# Patient Record
Sex: Male | Born: 1935 | Race: White | Hispanic: No | Marital: Married | Smoking: Former smoker
Health system: Southern US, Community
[De-identification: ages and names within clinical notes are randomized; demographics above are authoritative.]

---

## 2021-06-11 ENCOUNTER — Inpatient Hospital Stay (HOSPITAL_COMMUNITY)
Admission: EM | Admit: 2021-06-11 | Discharge: 2021-06-24 | DRG: 239 | Disposition: A | Discharge status: Skilled Nursing Facility | Payer: Medicare Other | Attending: Internal Medicine | Admitting: Internal Medicine

## 2021-06-11 ENCOUNTER — Emergency Department (HOSPITAL_COMMUNITY): Payer: Medicare Other

## 2021-06-11 DIAGNOSIS — Z79899 Other long term (current) drug therapy: Secondary | ICD-10-CM

## 2021-06-11 DIAGNOSIS — E1122 Type 2 diabetes mellitus with diabetic chronic kidney disease: Secondary | ICD-10-CM | POA: Diagnosis present

## 2021-06-11 DIAGNOSIS — D509 Iron deficiency anemia, unspecified: Secondary | ICD-10-CM | POA: Diagnosis present

## 2021-06-11 DIAGNOSIS — E1169 Type 2 diabetes mellitus with other specified complication: Secondary | ICD-10-CM | POA: Diagnosis present

## 2021-06-11 DIAGNOSIS — N179 Acute kidney failure, unspecified: Secondary | ICD-10-CM | POA: Diagnosis present

## 2021-06-11 DIAGNOSIS — L89616 Pressure-induced deep tissue damage of right heel: Secondary | ICD-10-CM | POA: Diagnosis present

## 2021-06-11 DIAGNOSIS — L089 Local infection of the skin and subcutaneous tissue, unspecified: Secondary | ICD-10-CM | POA: Diagnosis present

## 2021-06-11 DIAGNOSIS — E876 Hypokalemia: Secondary | ICD-10-CM | POA: Diagnosis present

## 2021-06-11 DIAGNOSIS — I2510 Atherosclerotic heart disease of native coronary artery without angina pectoris: Secondary | ICD-10-CM | POA: Diagnosis present

## 2021-06-11 DIAGNOSIS — L03032 Cellulitis of left toe: Secondary | ICD-10-CM | POA: Diagnosis present

## 2021-06-11 DIAGNOSIS — Z87891 Personal history of nicotine dependence: Secondary | ICD-10-CM

## 2021-06-11 DIAGNOSIS — N1831 Chronic kidney disease, stage 3a: Secondary | ICD-10-CM | POA: Diagnosis present

## 2021-06-11 DIAGNOSIS — I96 Gangrene, not elsewhere classified: Secondary | ICD-10-CM | POA: Diagnosis present

## 2021-06-11 DIAGNOSIS — Z7901 Long term (current) use of anticoagulants: Secondary | ICD-10-CM

## 2021-06-11 DIAGNOSIS — B351 Tinea unguium: Secondary | ICD-10-CM | POA: Diagnosis present

## 2021-06-11 DIAGNOSIS — Z951 Presence of aortocoronary bypass graft: Secondary | ICD-10-CM

## 2021-06-11 DIAGNOSIS — I4891 Unspecified atrial fibrillation: Secondary | ICD-10-CM | POA: Diagnosis present

## 2021-06-11 DIAGNOSIS — E440 Moderate protein-calorie malnutrition: Secondary | ICD-10-CM | POA: Diagnosis present

## 2021-06-11 DIAGNOSIS — I480 Paroxysmal atrial fibrillation: Secondary | ICD-10-CM | POA: Diagnosis present

## 2021-06-11 DIAGNOSIS — L0390 Cellulitis, unspecified: Secondary | ICD-10-CM | POA: Diagnosis present

## 2021-06-11 DIAGNOSIS — Z7951 Long term (current) use of inhaled steroids: Secondary | ICD-10-CM

## 2021-06-11 DIAGNOSIS — L03116 Cellulitis of left lower limb: Secondary | ICD-10-CM

## 2021-06-11 DIAGNOSIS — E11628 Type 2 diabetes mellitus with other skin complications: Secondary | ICD-10-CM | POA: Diagnosis present

## 2021-06-11 DIAGNOSIS — I130 Hypertensive heart and chronic kidney disease with heart failure and stage 1 through stage 4 chronic kidney disease, or unspecified chronic kidney disease: Secondary | ICD-10-CM | POA: Diagnosis present

## 2021-06-11 DIAGNOSIS — E860 Dehydration: Secondary | ICD-10-CM | POA: Diagnosis present

## 2021-06-11 DIAGNOSIS — L8990 Pressure ulcer of unspecified site, unspecified stage: Secondary | ICD-10-CM | POA: Diagnosis present

## 2021-06-11 DIAGNOSIS — E785 Hyperlipidemia, unspecified: Secondary | ICD-10-CM | POA: Diagnosis present

## 2021-06-11 DIAGNOSIS — E1165 Type 2 diabetes mellitus with hyperglycemia: Secondary | ICD-10-CM | POA: Diagnosis not present

## 2021-06-11 DIAGNOSIS — L89626 Pressure-induced deep tissue damage of left heel: Secondary | ICD-10-CM | POA: Diagnosis present

## 2021-06-11 DIAGNOSIS — E11621 Type 2 diabetes mellitus with foot ulcer: Secondary | ICD-10-CM | POA: Diagnosis present

## 2021-06-11 DIAGNOSIS — M869 Osteomyelitis, unspecified: Secondary | ICD-10-CM | POA: Diagnosis present

## 2021-06-11 DIAGNOSIS — L97529 Non-pressure chronic ulcer of other part of left foot with unspecified severity: Secondary | ICD-10-CM | POA: Diagnosis present

## 2021-06-11 DIAGNOSIS — E1152 Type 2 diabetes mellitus with diabetic peripheral angiopathy with gangrene: Secondary | ICD-10-CM | POA: Diagnosis not present

## 2021-06-11 DIAGNOSIS — Z7982 Long term (current) use of aspirin: Secondary | ICD-10-CM

## 2021-06-11 DIAGNOSIS — I509 Heart failure, unspecified: Secondary | ICD-10-CM | POA: Diagnosis present

## 2021-06-11 DIAGNOSIS — U071 COVID-19: Secondary | ICD-10-CM | POA: Diagnosis present

## 2021-06-11 DIAGNOSIS — E119 Type 2 diabetes mellitus without complications: Secondary | ICD-10-CM

## 2021-06-11 LAB — COMPREHENSIVE METABOLIC PANEL
ALT: 18 U/L (ref 0–44)
AST: 26 U/L (ref 15–41)
Albumin: 3.2 g/dL — ABNORMAL LOW (ref 3.5–5.0)
Alkaline Phosphatase: 51 U/L (ref 38–126)
Anion gap: 8 (ref 5–15)
BUN: 32 mg/dL — ABNORMAL HIGH (ref 8–23)
CO2: 21 mmol/L — ABNORMAL LOW (ref 22–32)
Calcium: 8.6 mg/dL — ABNORMAL LOW (ref 8.9–10.3)
Chloride: 107 mmol/L (ref 98–111)
Creatinine, Ser: 1.42 mg/dL — ABNORMAL HIGH (ref 0.61–1.24)
GFR, Estimated: 48 mL/min — ABNORMAL LOW (ref >60)
Glucose, Bld: 222 mg/dL — ABNORMAL HIGH (ref 70–99)
Potassium: 4 mmol/L (ref 3.5–5.1)
Sodium: 136 mmol/L (ref 135–145)
Total Bilirubin: 0.4 mg/dL (ref 0.3–1.2)
Total Protein: 6.3 g/dL — ABNORMAL LOW (ref 6.5–8.1)

## 2021-06-11 LAB — CBC WITH DIFFERENTIAL/PLATELET
Abs Immature Granulocytes: 0.02 10*3/uL (ref 0.00–0.07)
Basophils Absolute: 0 10*3/uL (ref 0.0–0.1)
Basophils Relative: 1 %
Eosinophils Absolute: 0.2 10*3/uL (ref 0.0–0.5)
Eosinophils Relative: 2 %
HCT: 42.4 % (ref 39.0–52.0)
Hemoglobin: 13.6 g/dL (ref 13.0–17.0)
Immature Granulocytes: 0 %
Lymphocytes Relative: 18 %
Lymphs Abs: 1.3 10*3/uL (ref 0.7–4.0)
MCH: 28.9 pg (ref 26.0–34.0)
MCHC: 32.1 g/dL (ref 30.0–36.0)
MCV: 90.2 fL (ref 80.0–100.0)
Monocytes Absolute: 0.5 10*3/uL (ref 0.1–1.0)
Monocytes Relative: 8 %
Neutro Abs: 4.9 10*3/uL (ref 1.7–7.7)
Neutrophils Relative %: 71 %
Platelets: 202 10*3/uL (ref 150–400)
RBC: 4.7 MIL/uL (ref 4.22–5.81)
RDW: 12.6 % (ref 11.5–15.5)
WBC: 7 10*3/uL (ref 4.0–10.5)
nRBC: 0 % (ref 0.0–0.2)

## 2021-06-11 LAB — APTT: aPTT: 44 seconds — ABNORMAL HIGH (ref 24–36)

## 2021-06-11 LAB — PROTIME-INR
INR: 1.3 — ABNORMAL HIGH (ref 0.8–1.2)
Prothrombin Time: 16.2 seconds — ABNORMAL HIGH (ref 11.4–15.2)

## 2021-06-11 LAB — LACTIC ACID, PLASMA: Lactic Acid, Venous: 0.8 mmol/L (ref 0.5–1.9)

## 2021-06-11 NOTE — ED Provider Notes (Signed)
Emergency Medicine Provider Triage Evaluation Note  Cameron Ruiz , a 87 y.o. male  was evaluated in triage.  Pt complains of left foot wound. Was at podiatrist pta and they were concerned for vascular etiology as they noted gangrenous changes to the foot and wer unable to find a pulse.  Review of Systems  Positive: Wound to foot Negative: fevers  Physical Exam  BP (!) 158/59   Pulse 64   Temp 99.8 F (37.7 C) (Oral)   Resp 16   SpO2 98%  Gen:   Awake, no distress   Resp:  Normal effort  MSK:   Moves extremities without difficulty Other:  Multiple open wounds to the left foot with erythema and warmth to the dorsum of the foot. DP pulse is dopplerable  Medical Decision Making  Medically screening exam initiated at 5:30 PM.  Appropriate orders placed.  Cameron Ruiz was informed that the remainder of the evaluation will be completed by another provider, this initial triage assessment does not replace that evaluation, and the importance of remaining in the ED until their evaluation is complete.     Rayne Du 06/11/21 1733    Gwyneth Sprout, MD 06/11/21 1756

## 2021-06-11 NOTE — ED Triage Notes (Signed)
Pt seen at podiatrist today, they were unable to find pedal pulses & noted "gangrenous changes," was advised to come to [facility] for further eval to rule out vascular issue. Pt states issue started in L leg/shin area, "weeping," then went down into foot. On 2 oral abx, compliant w both as well as home medicaitons. Pt legally blind, granddaughter w pt.

## 2021-06-11 NOTE — ED Notes (Addendum)
Pedal pulse found using doppler in triage, marked using skin marker

## 2021-06-12 ENCOUNTER — Inpatient Hospital Stay (HOSPITAL_COMMUNITY): Payer: Medicare Other

## 2021-06-12 ENCOUNTER — Encounter (HOSPITAL_COMMUNITY): Payer: Medicare Other

## 2021-06-12 ENCOUNTER — Encounter (HOSPITAL_COMMUNITY): Payer: Self-pay | Admitting: Emergency Medicine

## 2021-06-12 DIAGNOSIS — L089 Local infection of the skin and subcutaneous tissue, unspecified: Secondary | ICD-10-CM | POA: Diagnosis not present

## 2021-06-12 DIAGNOSIS — B351 Tinea unguium: Secondary | ICD-10-CM | POA: Diagnosis present

## 2021-06-12 DIAGNOSIS — N1831 Chronic kidney disease, stage 3a: Secondary | ICD-10-CM | POA: Diagnosis present

## 2021-06-12 DIAGNOSIS — I10 Essential (primary) hypertension: Secondary | ICD-10-CM | POA: Diagnosis not present

## 2021-06-12 DIAGNOSIS — L0390 Cellulitis, unspecified: Secondary | ICD-10-CM | POA: Diagnosis present

## 2021-06-12 DIAGNOSIS — M869 Osteomyelitis, unspecified: Secondary | ICD-10-CM | POA: Diagnosis present

## 2021-06-12 DIAGNOSIS — L03116 Cellulitis of left lower limb: Secondary | ICD-10-CM | POA: Diagnosis present

## 2021-06-12 DIAGNOSIS — I70223 Atherosclerosis of native arteries of extremities with rest pain, bilateral legs: Secondary | ICD-10-CM | POA: Diagnosis not present

## 2021-06-12 DIAGNOSIS — Z7982 Long term (current) use of aspirin: Secondary | ICD-10-CM | POA: Diagnosis not present

## 2021-06-12 DIAGNOSIS — Z7901 Long term (current) use of anticoagulants: Secondary | ICD-10-CM | POA: Diagnosis not present

## 2021-06-12 DIAGNOSIS — E1169 Type 2 diabetes mellitus with other specified complication: Secondary | ICD-10-CM | POA: Diagnosis present

## 2021-06-12 DIAGNOSIS — L89626 Pressure-induced deep tissue damage of left heel: Secondary | ICD-10-CM | POA: Diagnosis present

## 2021-06-12 DIAGNOSIS — E440 Moderate protein-calorie malnutrition: Secondary | ICD-10-CM | POA: Diagnosis present

## 2021-06-12 DIAGNOSIS — I70262 Atherosclerosis of native arteries of extremities with gangrene, left leg: Secondary | ICD-10-CM | POA: Diagnosis not present

## 2021-06-12 DIAGNOSIS — E1152 Type 2 diabetes mellitus with diabetic peripheral angiopathy with gangrene: Secondary | ICD-10-CM | POA: Diagnosis present

## 2021-06-12 DIAGNOSIS — E1122 Type 2 diabetes mellitus with diabetic chronic kidney disease: Secondary | ICD-10-CM | POA: Diagnosis present

## 2021-06-12 DIAGNOSIS — I2510 Atherosclerotic heart disease of native coronary artery without angina pectoris: Secondary | ICD-10-CM | POA: Diagnosis present

## 2021-06-12 DIAGNOSIS — E11621 Type 2 diabetes mellitus with foot ulcer: Secondary | ICD-10-CM | POA: Diagnosis present

## 2021-06-12 DIAGNOSIS — E119 Type 2 diabetes mellitus without complications: Secondary | ICD-10-CM | POA: Diagnosis not present

## 2021-06-12 DIAGNOSIS — I96 Gangrene, not elsewhere classified: Secondary | ICD-10-CM | POA: Diagnosis not present

## 2021-06-12 DIAGNOSIS — U071 COVID-19: Secondary | ICD-10-CM | POA: Diagnosis present

## 2021-06-12 DIAGNOSIS — D509 Iron deficiency anemia, unspecified: Secondary | ICD-10-CM | POA: Diagnosis present

## 2021-06-12 DIAGNOSIS — I4891 Unspecified atrial fibrillation: Secondary | ICD-10-CM | POA: Diagnosis not present

## 2021-06-12 DIAGNOSIS — L97529 Non-pressure chronic ulcer of other part of left foot with unspecified severity: Secondary | ICD-10-CM | POA: Diagnosis present

## 2021-06-12 DIAGNOSIS — E1165 Type 2 diabetes mellitus with hyperglycemia: Secondary | ICD-10-CM | POA: Diagnosis not present

## 2021-06-12 DIAGNOSIS — I480 Paroxysmal atrial fibrillation: Secondary | ICD-10-CM | POA: Diagnosis present

## 2021-06-12 DIAGNOSIS — I130 Hypertensive heart and chronic kidney disease with heart failure and stage 1 through stage 4 chronic kidney disease, or unspecified chronic kidney disease: Secondary | ICD-10-CM | POA: Diagnosis present

## 2021-06-12 DIAGNOSIS — E11628 Type 2 diabetes mellitus with other skin complications: Secondary | ICD-10-CM | POA: Diagnosis present

## 2021-06-12 DIAGNOSIS — I509 Heart failure, unspecified: Secondary | ICD-10-CM | POA: Diagnosis present

## 2021-06-12 DIAGNOSIS — Z7951 Long term (current) use of inhaled steroids: Secondary | ICD-10-CM | POA: Diagnosis not present

## 2021-06-12 DIAGNOSIS — N179 Acute kidney failure, unspecified: Secondary | ICD-10-CM | POA: Diagnosis present

## 2021-06-12 DIAGNOSIS — L89616 Pressure-induced deep tissue damage of right heel: Secondary | ICD-10-CM | POA: Diagnosis present

## 2021-06-12 LAB — URINALYSIS, ROUTINE W REFLEX MICROSCOPIC
Bacteria, UA: NONE SEEN
Bilirubin Urine: NEGATIVE
Glucose, UA: NEGATIVE mg/dL
Ketones, ur: NEGATIVE mg/dL
Leukocytes,Ua: NEGATIVE
Nitrite: NEGATIVE
Protein, ur: 100 mg/dL — AB
Specific Gravity, Urine: 1.013 (ref 1.005–1.030)
pH: 6 (ref 5.0–8.0)

## 2021-06-12 LAB — COMPREHENSIVE METABOLIC PANEL
ALT: 17 U/L (ref 0–44)
AST: 25 U/L (ref 15–41)
Albumin: 3.3 g/dL — ABNORMAL LOW (ref 3.5–5.0)
Alkaline Phosphatase: 49 U/L (ref 38–126)
Anion gap: 9 (ref 5–15)
BUN: 28 mg/dL — ABNORMAL HIGH (ref 8–23)
CO2: 22 mmol/L (ref 22–32)
Calcium: 8.7 mg/dL — ABNORMAL LOW (ref 8.9–10.3)
Chloride: 102 mmol/L (ref 98–111)
Creatinine, Ser: 1.22 mg/dL (ref 0.61–1.24)
GFR, Estimated: 57 mL/min — ABNORMAL LOW (ref >60)
Glucose, Bld: 188 mg/dL — ABNORMAL HIGH (ref 70–99)
Potassium: 4.4 mmol/L (ref 3.5–5.1)
Sodium: 133 mmol/L — ABNORMAL LOW (ref 135–145)
Total Bilirubin: 0.7 mg/dL (ref 0.3–1.2)
Total Protein: 6.6 g/dL (ref 6.5–8.1)

## 2021-06-12 LAB — CBC
HCT: 34.1 % — ABNORMAL LOW (ref 39.0–52.0)
Hemoglobin: 10.9 g/dL — ABNORMAL LOW (ref 13.0–17.0)
MCH: 29.3 pg (ref 26.0–34.0)
MCHC: 32 g/dL (ref 30.0–36.0)
MCV: 91.7 fL (ref 80.0–100.0)
Platelets: 239 10*3/uL (ref 150–400)
RBC: 3.72 MIL/uL — ABNORMAL LOW (ref 4.22–5.81)
RDW: 12.5 % (ref 11.5–15.5)
WBC: 8.1 10*3/uL (ref 4.0–10.5)
nRBC: 0 % (ref 0.0–0.2)

## 2021-06-12 LAB — HEMOGLOBIN A1C
Hgb A1c MFr Bld: 5.9 % — ABNORMAL HIGH (ref 4.8–5.6)
Mean Plasma Glucose: 122.63 mg/dL

## 2021-06-12 LAB — RESP PANEL BY RT-PCR (FLU A&B, COVID) ARPGX2
Influenza A by PCR: NEGATIVE
Influenza B by PCR: NEGATIVE
SARS Coronavirus 2 by RT PCR: NEGATIVE

## 2021-06-12 LAB — CBG MONITORING, ED
Glucose-Capillary: 175 mg/dL — ABNORMAL HIGH (ref 70–99)
Glucose-Capillary: 153 mg/dL — ABNORMAL HIGH (ref 70–99)
Glucose-Capillary: 142 mg/dL — ABNORMAL HIGH (ref 70–99)

## 2021-06-12 LAB — GLUCOSE, CAPILLARY: Glucose-Capillary: 139 mg/dL — ABNORMAL HIGH (ref 70–99)

## 2021-06-12 LAB — PROTIME-INR
INR: 1.2 (ref 0.8–1.2)
Prothrombin Time: 15.3 seconds — ABNORMAL HIGH (ref 11.4–15.2)

## 2021-06-12 LAB — LACTIC ACID, PLASMA: Lactic Acid, Venous: 1.1 mmol/L (ref 0.5–1.9)

## 2021-06-12 LAB — PHOSPHORUS: Phosphorus: 2.3 mg/dL — ABNORMAL LOW (ref 2.5–4.6)

## 2021-06-12 LAB — MAGNESIUM: Magnesium: 2 mg/dL (ref 1.7–2.4)

## 2021-06-12 MED ORDER — PIPERACILLIN-TAZOBACTAM 3.375 G IVPB 30 MIN
3.3750 g | Freq: Once | INTRAVENOUS | Status: AC
  Administered 2021-06-12: 3.375 g via INTRAVENOUS
  Filled 2021-06-12: qty 50

## 2021-06-12 MED ORDER — SIMVASTATIN 20 MG PO TABS
20.0000 mg | ORAL_TABLET | Freq: Every day | ORAL | Status: DC
  Administered 2021-06-12 – 2021-06-23 (×12): 20 mg via ORAL
  Filled 2021-06-12 (×12): qty 1

## 2021-06-12 MED ORDER — HYDRALAZINE HCL 50 MG PO TABS
100.0000 mg | ORAL_TABLET | Freq: Three times a day (TID) | ORAL | Status: DC
  Administered 2021-06-12 – 2021-06-24 (×33): 100 mg via ORAL
  Filled 2021-06-12 (×35): qty 2

## 2021-06-12 MED ORDER — INSULIN ASPART 100 UNIT/ML IJ SOLN
0.0000 [IU] | Freq: Three times a day (TID) | INTRAMUSCULAR | Status: DC
  Administered 2021-06-13: 2 [IU] via SUBCUTANEOUS
  Administered 2021-06-13 (×2): 1 [IU] via SUBCUTANEOUS
  Administered 2021-06-14 (×2): 2 [IU] via SUBCUTANEOUS
  Administered 2021-06-15 – 2021-06-16 (×2): 1 [IU] via SUBCUTANEOUS
  Administered 2021-06-16: 2 [IU] via SUBCUTANEOUS
  Administered 2021-06-17 – 2021-06-18 (×2): 1 [IU] via SUBCUTANEOUS
  Administered 2021-06-19: 2 [IU] via SUBCUTANEOUS
  Administered 2021-06-19 (×2): 1 [IU] via SUBCUTANEOUS
  Administered 2021-06-20: 5 [IU] via SUBCUTANEOUS
  Administered 2021-06-20: 2 [IU] via SUBCUTANEOUS
  Administered 2021-06-21: 1 [IU] via SUBCUTANEOUS
  Administered 2021-06-21 (×2): 2 [IU] via SUBCUTANEOUS
  Administered 2021-06-22 (×2): 3 [IU] via SUBCUTANEOUS
  Administered 2021-06-22: 2 [IU] via SUBCUTANEOUS
  Administered 2021-06-23: 3 [IU] via SUBCUTANEOUS
  Administered 2021-06-23: 5 [IU] via SUBCUTANEOUS
  Administered 2021-06-23 – 2021-06-24 (×3): 2 [IU] via SUBCUTANEOUS

## 2021-06-12 MED ORDER — CARVEDILOL 6.25 MG PO TABS
6.2500 mg | ORAL_TABLET | Freq: Two times a day (BID) | ORAL | Status: DC
  Administered 2021-06-12 – 2021-06-24 (×18): 6.25 mg via ORAL
  Filled 2021-06-12 (×23): qty 1

## 2021-06-12 MED ORDER — VANCOMYCIN HCL IN DEXTROSE 1-5 GM/200ML-% IV SOLN
1000.0000 mg | Freq: Once | INTRAVENOUS | Status: DC
  Filled 2021-06-12: qty 200

## 2021-06-12 MED ORDER — VANCOMYCIN HCL IN DEXTROSE 1-5 GM/200ML-% IV SOLN: 1000.0000 mg | Freq: Once | INTRAVENOUS | Status: DC

## 2021-06-12 MED ORDER — TORSEMIDE 20 MG PO TABS
10.0000 mg | ORAL_TABLET | Freq: Every morning | ORAL | Status: DC
  Administered 2021-06-13 – 2021-06-17 (×4): 10 mg via ORAL
  Filled 2021-06-12 (×4): qty 1

## 2021-06-12 MED ORDER — ACETAMINOPHEN 650 MG RE SUPP: 650.0000 mg | Freq: Four times a day (QID) | RECTAL | Status: DC | PRN

## 2021-06-12 MED ORDER — ACETAMINOPHEN 325 MG PO TABS
650.0000 mg | ORAL_TABLET | Freq: Four times a day (QID) | ORAL | Status: DC | PRN
  Administered 2021-06-13 – 2021-06-14 (×2): 650 mg via ORAL
  Filled 2021-06-12 (×2): qty 2

## 2021-06-12 MED ORDER — ASPIRIN 81 MG PO CHEW
81.0000 mg | CHEWABLE_TABLET | Freq: Once | ORAL | Status: AC
  Administered 2021-06-12: 81 mg via ORAL
  Filled 2021-06-12: qty 1

## 2021-06-12 MED ORDER — VANCOMYCIN HCL IN DEXTROSE 1-5 GM/200ML-% IV SOLN
1000.0000 mg | Freq: Once | INTRAVENOUS | Status: AC
  Administered 2021-06-12: 1000 mg via INTRAVENOUS
  Filled 2021-06-12: qty 200

## 2021-06-12 MED ORDER — FINASTERIDE 5 MG PO TABS
5.0000 mg | ORAL_TABLET | Freq: Once | ORAL | Status: AC
  Administered 2021-06-12: 5 mg via ORAL
  Filled 2021-06-12: qty 1

## 2021-06-12 MED ORDER — SODIUM CHLORIDE 0.9% FLUSH
3.0000 mL | Freq: Two times a day (BID) | INTRAVENOUS | Status: DC
  Administered 2021-06-12 – 2021-06-24 (×10): 3 mL via INTRAVENOUS

## 2021-06-12 MED ORDER — LOSARTAN POTASSIUM 50 MG PO TABS
100.0000 mg | ORAL_TABLET | Freq: Every day | ORAL | Status: DC
  Administered 2021-06-12 – 2021-06-17 (×6): 100 mg via ORAL
  Filled 2021-06-12 (×6): qty 2

## 2021-06-12 MED ORDER — PANTOPRAZOLE SODIUM 40 MG PO TBEC
40.0000 mg | DELAYED_RELEASE_TABLET | Freq: Every day | ORAL | Status: DC
  Administered 2021-06-12 – 2021-06-24 (×12): 40 mg via ORAL
  Filled 2021-06-12 (×13): qty 1

## 2021-06-12 MED ORDER — VANCOMYCIN HCL IN DEXTROSE 1-5 GM/200ML-% IV SOLN
1000.0000 mg | INTRAVENOUS | Status: DC
  Administered 2021-06-13 – 2021-06-17 (×5): 1000 mg via INTRAVENOUS
  Filled 2021-06-12 (×7): qty 200

## 2021-06-12 MED ORDER — INSULIN ASPART 100 UNIT/ML IJ SOLN
0.0000 [IU] | INTRAMUSCULAR | Status: DC
  Administered 2021-06-12: 1 [IU] via SUBCUTANEOUS

## 2021-06-12 NOTE — Progress Notes (Signed)
Pharmacy Antibiotic Note  Cameron Ruiz is a 87 y.o. male admitted on 06/11/2021 with  osteomyelitis .  Pharmacy has been consulted for vancomycin dosing.  Diabetic patient presenting to ED with left foot wound. Per MD note, patient noticed "weeping" and redness in his left shin & left foot. Seen outpatient by PCP and podiatry and completed two courses of amoxicillin, a course of fluconazole, and a dose of rocephin. Visited podiatry yesterday and was referred to ED after pulses could not be found.  Notably, 1g vancomycin given this morning in the ED prior to pharmacy consultation.  SCr 1.42 on presentation, currently is 1.22. WBC 8.1.  Plan: Will plan for 1000 mg q24h unless change in renal function for [facility] of 512 F/u podiatry and cultures Adjust antibiotics as appropriate  Height: 5\' 9"  (175.3 cm) Weight: 99.8 kg (220 lb) IBW/kg (Calculated) : 70.7  Temp (24hrs), Avg:98.6 F (37 C), Min:98.1 F (36.7 C), Max:99.8 F (37.7 C)  Recent Labs  Lab 06/11/21 1732 06/11/21 1830 06/12/21 0830 06/12/21 1022  WBC 7.0  --   --  8.1  CREATININE 1.42*  --   --  1.22  LATICACIDVEN  --  0.8 1.1  --     Estimated Creatinine Clearance: 48.7 mL/min (by C-G formula based on SCr of 1.22 mg/dL).    Allergies  Allergen Reactions   Carbocaine [Mepivacaine]    Cogentin [Benztropine]    Fluogen [Influenza Virus Vaccine]     Flu & pneumonia vaccine   Keflex [Cephalexin]    Kenalog [Triamcinolone]    Levaquin [Levofloxacin]    Antimicrobials this admission: Zosyn 3.375g once in ED Vancomycin 7/30 >>   Microbiology results: Pending  Thank you for allowing pharmacy to be a part of this patient's care.  8/30 06/12/2021 1:49 PM

## 2021-06-12 NOTE — ED Notes (Signed)
Admitting at bedside 

## 2021-06-12 NOTE — H&P (Addendum)
Date: 06/12/2021               Patient Name:  Cameron Ruiz MRN: [id number]  DOB: 1931-09-19 Age / Sex: 87 y.o., male   PCP: Glori Bickers, MD              Medical Service: Internal Medicine Teaching Service              Attending Physician: Dr. Debe Coder    First Contact: Edgardo Roys, MS 3 Pager: [phone]  Second Contact: Dr. Adron Bene Pager: [phone]  Third Contact Dr. Dellia Cloud Pager: [phone]       After Hours (After 5p/  First Contact Pager: [phone]  weekends / holidays): Second Contact Pager: [phone]   Chief Complaint: Left foot pain   History of Present Illness: Cameron Ruiz is a 87 y.o. man living with diabetes on insulin, cardiovascular disease s/p CABG, HTN, and HLD who presents to the emergency department with a left foot wound with pain. Approximately two weeks ago, he began to have "weeping" and redness in his left shin that moved downward to his left foot. His foot became erythematous, swollen, and painful which caused him to seek care from his primary care physician and podiatrist. He has two courses of amoxicillin, a course of fluconazole, and a dose of rocephin without resolution of his symptoms. He states his pain has improved since beginning treatment, but has not fully resolved. He has developed some discoloration with distinct areas of "black" skin. He visited his podiatrist yesterday where he was found to have non-palpable pulses in the left lower extremity, so he was sent to the emergency department for further care.  He reports left foot pain with ambulation that seems to resolve after he takes his first few steps. He has also had occasional burning sensations in the left foot, but he denies significant loss of sensation, numbness, and tingling. He does not recall every receiving a workup for vascular disease in his extremities, and he denies cramping in his calves. He denies any trauma to the area.  It is unclear to him if he has ever  been told he has heart failure, but he denies chest pain and shortness of breath. He states he does not lay down flat at night but is unsure if this is because he becomes short of breath in a flat position.  Otherwise, he denies fevers, chills, recent travel, or changes in medications.  Meds: Novolog Mix 70/30 - 20 units morning and 20 units at night Aspirin 81 mg daily Eliquis 2.5 mg BID Hydralazine 100 mg TID Losartan 100 mg daily Carvedilol 6.25 mg BID Nitroglycerin 0.4 mg prn Simvastatin 20 mg daily Doxepin 50 mg daily Ipratropium/Albuterol 0.5/0.3 BID prn Vitamin D 50,000 Units once per week Torsemide 10 mg daily Finasteride 5 mg daily Tylenol 325 mg daily at bedtime Omeprazole 20 mg  Allergies: Allergies as of 06/11/2021 - Review Complete 06/11/2021  Allergen Reaction Noted   Carbocaine [mepivacaine]  06/11/2021   Cogentin [benztropine]  06/11/2021   Fluogen [influenza virus vaccine]  06/11/2021   Keflex [cephalexin]  06/11/2021   Kenalog [triamcinolone]  06/11/2021   Levaquin [levofloxacin]  06/11/2021  Pneumonia Vaccine Flu Vaccine  Past Medical History: CAD s/p CABG HTN HLD DM Possible CHF Possible cardiac arrhythmia  Patient lives in [place] and outside records are not readily available.  Family History: No pertinent family history obtained.  Social History: Patient lives at home with his wife of 60 years. She helps  him manage his medicines. He uses a walker to ambulate but states he does not get up and around frequently.  Review of Systems: A complete ROS was negative except as per HPI.  Physical Exam: Blood pressure (!) 168/70, pulse 66, temperature 98.1 F (36.7 C), temperature source Oral, resp. rate 18, height 5\' 9"  (1.753 m), weight 99.8 kg, SpO2 99 %.  Constitutional: well-appearing and sitting in bed comfortably, in no acute distress HENT: normocephalic atraumatic Eyes: conjunctiva non-erythematous Neck: supple Cardiovascular: regular  rate and rhythm, no m/r/g Pulmonary/Chest: normal work of breathing on room air Abdominal: no visible abnormalities MSK: mildly erythematous anterior left shin, left foot is swollen, erythematous, with dry gangrene present on the ventral surface of the hallux and second toe, white/yellow flaky skin present diffusely over the toes and dorsal surface of the left foot, onychomycosis present, dorsalis pedis and posterior tibialis pulses are non-palpable and foot is cold to the touch, see attached photos Neurological: alert and answering questions appropriately Skin: senile purpura present, skin is dry, no rashes Psych: appropriate mood and affect  Pertinent Labs: CBC    Component Value Date/Time   WBC 7.0 06/11/2021 1732   RBC 4.70 06/11/2021 1732   HGB 13.6 06/11/2021 1732   HCT 42.4 06/11/2021 1732   PLT 202 06/11/2021 1732   MCV 90.2 06/11/2021 1732   MCH 28.9 06/11/2021 1732   MCHC 32.1 06/11/2021 1732   RDW 12.6 06/11/2021 1732   LYMPHSABS 1.3 06/11/2021 1732   MONOABS 0.5 06/11/2021 1732   EOSABS 0.2 06/11/2021 1732   BASOSABS 0.0 06/11/2021 1732   CMP     Component Value Date/Time   NA 136 06/11/2021 1732   K 4.0 06/11/2021 1732   CL 107 06/11/2021 1732   CO2 21 (L) 06/11/2021 1732   GLUCOSE 222 (H) 06/11/2021 1732   BUN 32 (H) 06/11/2021 1732   CREATININE 1.42 (H) 06/11/2021 1732   CALCIUM 8.6 (L) 06/11/2021 1732   PROT 6.3 (L) 06/11/2021 1732   ALBUMIN 3.2 (L) 06/11/2021 1732   AST 26 06/11/2021 1732   ALT 18 06/11/2021 1732   ALKPHOS 51 06/11/2021 1732   BILITOT 0.4 06/11/2021 1732   GFRNONAA 48 (L) 06/11/2021 1732   PT/INR PT - 16.2 INR - 1.3  aPTT aPTT - 44  Lactic Acid Lactic Acid, Venous - 0.8  Pertinent Imaging: X-Ray Left Foot: no evidence of focal bone abnormality, vascular calcifications noted, soft tissues unremarkable  Assessment & Plan by Problem: Principal Problem:   Left Lower Extremity Cellulitis 2/2 Diabetic Foot Infection Active  Problems:   CAD s/p CABG   HTN   HLD   DM   Possible CHF   Possible cardiac arrhythmia     Patient Summary: Cameron Ruiz is a 87 y.o. man living with diabetes on insulin, cardiovascular disease s/p CABG, HTN, and HLD who presents with a left foot wound with pain and is being admitted for left lower extremity cellulitis 2/2 diabetic foot infection, complicated by likely micro and macrovascular insufficiency and failure of outpatient antibiotic and antifungal treatment.  Left Lower Extremity Cellulitis 2/2 Diabetic Foot Infection Vascular Disease Patient presents with a multiple week history of left foot infection complicated by diabetes and vascular disease. Patient has failed outpatient antibiotic and antifungal treatment. Patient with dry gangrene on left great toe and second toes and foot cold to touch. Dorsalis pedis and posterior tibial pulses non-palpable. Diminished blood flow to extremity might limit efficacy of IV antibiotic treatment, so plan to  assess perfusion to the left foot. Necrotic tissue on hallux and second toe may benefit from wound care moving forward. At this time x-ray does not indicate concerns for osteomyelitis or soft tissue abscess. Plan to obtain MRI to further rule out bone/soft tissue pathology. Goal to heal infection with IV antibiotic treatment to avoid need for amputation, but may require additional surgical assistance in the near future. -Single dose IV Zosyn 3.375 g -IV Vancomycin 1000 mg -ABIs to assess perfusion -MRI left foot -Acetaminophen 650 mg every 6 hours prn for pain -Monitor BMP for renal function -Blood cultures pending  CAD s/p CABG HLD Patient states he has had open heart surgery for CABG approx. 20 years ago. Pt on ASA 81 mg daily, nitro prn, and statin at home. -Aspirin 81 mg daily -Nitroglycerin 0.4 mg prn -Simvastatin 20 mg daily  HTN Patient on Hydralazine 100 mg TID, Losartan 100 mg daily, Carvedilol 6.25 mg BID at home for blood  pressure control. BP is 168/70. -Resume home BP regimen  DM Patient on Novolog mix 70/30 - 20 units morning and 20 units at night at home. -Start SSI every 4 hours as patient is NPO -Will switch back to insulin with meals once he is able to eat  Possible CHF History of CABG. On torsemide at home. -Continue Torsemide 10 mg daily  Possible cardiac arrhythmia History of cardiovascular disease and on eliquis at home. -Continue Eliquis 2.5 mg BID  Dispo: Admit patient to Inpatient with expected length of stay greater than 2 midnights.  Signed: Val Eagle, Medical Student 06/12/2021, 9:41 AM  Pager: [phone]  Attestation for Student Documentation:  I personally was present and performed or re-performed the history, physical exam and medical decision-making activities of this service and have verified that the service and findings are accurately documented in the student's note.  Chari Manning, D.O.  Internal Medicine Resident, PGY-3 [facility]  Pager: [phone] 12:55 PM, 06/12/2021   **Please contact the on call pager after 5 pm and on weekends at [phone].**

## 2021-06-12 NOTE — Hospital Course (Addendum)
Cameron Ruiz was admitted for management of a left diabetic foot infection with gangrene and third digit osteomyelitis complicated by limb threatening ischemia secondary to severe peripheral vascular disease.  Left Diabetic Foot Infection Gangrene Left Third Digit Osteomyelitis Limb Threatening Ischemia 2/2 Severe PVD, now POD6 L BKA Antibiotic treatment was started with a regimen of IV vancomycin, cefepime, and flagyl. Arteriogram performed on hospital day four revealed multiple occlusions in the left lower extremity and attempts to cross the lesions were unsuccessful. On hospital day seven, a left BKA was performed. Antibiotic treatment was completed 48 hours postoperatively, and patient was placed in an ampushield for left knee contracture. PT/OT recommended SNF placement for rehabilitation before returning home.  AKI Creatinine became elevated to 1.87 on hospital day nine with a BUN/Cr ratio of >20. Home losartan and torsemide were held and patient was given IVF boluses on subsequent hospital days for suspected dehydration induced pre-renal acute kidney injury as patient had limited oral intake in the days following surgery. Cr steadily improved to 1.42 with the addition of fluids and holding of losartan.  Diabetes Mellitus Type II Patient was managed with sliding scale insulin throughout admission. On his last two hospital days, Novolog 3 units TID with consumption of at least 50% of meals and Lantus 5 units QHS were added.  HTN Home regimen was continued with hydralazine 100 mg TID, coreg 6.25 mg daily, and losartan 100 mg daily with temporary holding as above. Amlodipine 5 mg was added on last two hospital days as blood pressures became elevated in the setting of holding his losartan for the AKI.  Pressure Injury Right Heel Patient noted to have a boggy heel on physical exam, but skin integrity remained intact. Prevalon boots were used to prevent further damage to the deep  tissue.  COVID-19 Incidental finding upon anticipated discharge to SNF. Patient has been afebrile and asymptomatic.  Paroxysmal Atrial Fibrillation Monitoring was performed with telemetry and anticoagulation was administered with heparin gtt until two days after surgery when home Eliquis was resumed.  CAD s/p CABG Home regimen of ASA 81 mg and nitroglycerin 0.4 mg prn were continued.  HLD Home regiment of simvastatin 20 mg daily was continued.   hydrochlorothiazide (HYZAAR) 100-25 mg tablet       0 08/17/2009   Active  clopidogrel (PLAVIX) 75 mg tablet       0 08/18/2009   Active  atenolol (TENORMIN) 25 MG tablet       0 08/17/2009   Active  lisinopril (PRINIVIL,ZESTRIL) 40 MG tablet       0 11/16/2009   Active  lisinopril (PRINIVIL,ZESTRIL) 20 MG tablet       0 10/14/2009   Active  ezetimibe-simvastatin (VYTORIN) 10-20 mg tablet       0 11/16/2009   Active  doxepin (SINEQUAN) 50 MG capsule       0 10/06/2009   Active  amLODIPine (NORVASC) 10 MG tablet       0 10/16/2009   Active  insulin NPH & insulin regular (NOVOLIN 70/30) 100 unit/mL (70-30) injection       0 11/16/2009   Active  nitroglycerin (NITROSTAT) 0.4 MG SL tablet       0 11/16/2009   Active  hydrALAZINE (APRESOLINE) 50 MG tablet       0 11/16/2009   Active  losartan (COZAAR) 100 MG tablet       0 10/24/2009   Active  hydrALAZINE (APRESOLINE) 100 MG tablet  0 01/20/2010   Active  hydrochlorothiazide (MICROZIDE) 12.5 mg capsule       0 09/15/2010   Active  ergocalciferol (DRISDOL) 50,000 unit capsule       0 07/29/2010   Active  NOVOLOG MIX 70-30 U-100 INSULN 100 unit/mL (70-30) injection   ADMINSTER 30-20 UNITS UNDER THE SKIN QD   0 08/05/2019   Active  carvediloL (COREG) 6.25 MG tablet   TK 1 T PO BID   0 07/29/2019   Active  ELIQUIS 5 mg tablet   Take by mouth 2 (two) times daily   0 07/20/2019   Active  TORsemide (DEMADEX) 10 MG tablet   Take by mouth once daily   0 08/05/2019   Active  simvastatin (ZOCOR) 20 MG  tablet   Take by mouth once daily   0 07/18/2019   Active  finasteride (PROSCAR) 5 mg tablet   Take by mouth once daily   0 07/29/2019   Active  ipratropium-albuteroL (DUO-NEB) nebulizer solution   U 3 ML VIA NEB BID   6 08/28/2017   Active  aspirin 81 MG chewable tablet   Take 81 mg by mouth once daily   0     Active  ergocalciferol, vitamin D2, (VITAMIN D2) 1,250 mcg (50,000 unit) capsule

## 2021-06-12 NOTE — ED Provider Notes (Signed)
[facility] EMERGENCY DEPARTMENT Provider Note   CSN: [id number] Arrival date & time: 06/11/21  1701     History Chief Complaint  Patient presents with   Foot Pain    Cameron Ruiz is a 87 y.o. male.  Patient with hx diabetes, presents with redness, swelling and pain to left foot, as well as small area dry gangrene to great toe. Symptoms acute onset in past 1-2 weeks, moderate, constant, persistent.  States has seen pcp/podiatry with same, has been on two antibiotics, including just now completing course of augmentin and having received im rocephin as outpt - states redness to foot is less intense than prior, but symptoms not resolving, and now also w dark area to great toe. States yesterday his podiatrist sent him to ED for higher level of care. No fever/chills. Denies calf pain or claudication.   The history is provided by the patient, a relative and medical records.  Foot Pain Pertinent negatives include no chest pain, no abdominal pain and no shortness of breath.      History reviewed. No pertinent past medical history.  There are no problems to display for this patient.   History reviewed. No pertinent surgical history.     History reviewed. No pertinent family history.     Home Medications Prior to Admission medications   Not on File    Allergies    Carbocaine [mepivacaine], Cogentin [benztropine], Fluogen [influenza virus vaccine], Keflex [cephalexin], Kenalog [triamcinolone], and Levaquin [levofloxacin]  Review of Systems   Review of Systems  Constitutional:  Negative for fever.  HENT:  Negative for sore throat.   Eyes:  Negative for redness.  Respiratory:  Negative for shortness of breath.   Cardiovascular:  Negative for chest pain.  Gastrointestinal:  Negative for abdominal pain.  Genitourinary:  Negative for flank pain.  Musculoskeletal:  Negative for back pain.       Left foot/toes w redness, pain and swelling  Skin:  Negative  for rash.  Neurological:  Negative for numbness.  Hematological:  Does not bruise/bleed easily.  Psychiatric/Behavioral:  Negative for confusion.    Physical Exam Updated Vital Signs BP 135/73 (BP Location: Right Arm)   Pulse 60   Temp 98.1 F (36.7 C) (Oral)   Resp 16   SpO2 100%   Physical Exam Vitals and nursing note reviewed.  Constitutional:      Appearance: Normal appearance. He is well-developed.  HENT:     Head: Atraumatic.     Nose: Nose normal.     Mouth/Throat:     Mouth: Mucous membranes are moist.     Pharynx: Oropharynx is clear.  Eyes:     General: No scleral icterus.    Conjunctiva/sclera: Conjunctivae normal.  Neck:     Trachea: No tracheal deviation.  Cardiovascular:     Rate and Rhythm: Normal rate.     Pulses: Normal pulses.  Pulmonary:     Effort: Pulmonary effort is normal. No accessory muscle usage or respiratory distress.  Abdominal:     General: There is no distension.     Tenderness: There is no abdominal tenderness.  Genitourinary:    Comments: No cva tenderness. Musculoskeletal:     Cervical back: Normal range of motion and neck supple. No rigidity.     Comments: Left foot/toes with erythema, mild-mod soft tissue swelling, increased warmth, and tenderness c/w cellulitis. Dp/pt palp. Small area dry gangrene to left great toe and 2nd toe.  See attached photos.  Skin:    General: Skin is warm and dry.     Findings: No rash.  Neurological:     Mental Status: He is alert.     Comments: Alert, speech clear.   Psychiatric:        Mood and Affect: Mood normal.    ED Results / Procedures / Treatments   Labs (all labs ordered are listed, but only abnormal results are displayed) Results for orders placed or performed during the hospital encounter of 06/11/21  Lactic acid, plasma  Result Value Ref Range   Lactic Acid, Venous 0.8 0.5 - 1.9 mmol/L  Comprehensive metabolic panel  Result Value Ref Range   Sodium 136 135 - 145 mmol/L    Potassium 4.0 3.5 - 5.1 mmol/L   Chloride 107 98 - 111 mmol/L   CO2 21 (L) 22 - 32 mmol/L   Glucose, Bld 222 (H) 70 - 99 mg/dL   BUN 32 (H) 8 - 23 mg/dL   Creatinine, Ser 2.09 (H) 0.61 - 1.24 mg/dL   Calcium 8.6 (L) 8.9 - 10.3 mg/dL   Total Protein 6.3 (L) 6.5 - 8.1 g/dL   Albumin 3.2 (L) 3.5 - 5.0 g/dL   AST 26 15 - 41 U/L   ALT 18 0 - 44 U/L   Alkaline Phosphatase 51 38 - 126 U/L   Total Bilirubin 0.4 0.3 - 1.2 mg/dL   GFR, Estimated 48 (L) >60 mL/min   Anion gap 8 5 - 15  CBC WITH DIFFERENTIAL  Result Value Ref Range   WBC 7.0 4.0 - 10.5 K/uL   RBC 4.70 4.22 - 5.81 MIL/uL   Hemoglobin 13.6 13.0 - 17.0 g/dL   HCT 47.0 96.2 - 83.6 %   MCV 90.2 80.0 - 100.0 fL   MCH 28.9 26.0 - 34.0 pg   MCHC 32.1 30.0 - 36.0 g/dL   RDW 62.9 47.6 - 54.6 %   Platelets 202 150 - 400 K/uL   nRBC 0.0 0.0 - 0.2 %   Neutrophils Relative % 71 %   Neutro Abs 4.9 1.7 - 7.7 K/uL   Lymphocytes Relative 18 %   Lymphs Abs 1.3 0.7 - 4.0 K/uL   Monocytes Relative 8 %   Monocytes Absolute 0.5 0.1 - 1.0 K/uL   Eosinophils Relative 2 %   Eosinophils Absolute 0.2 0.0 - 0.5 K/uL   Basophils Relative 1 %   Basophils Absolute 0.0 0.0 - 0.1 K/uL   Immature Granulocytes 0 %   Abs Immature Granulocytes 0.02 0.00 - 0.07 K/uL  Protime-INR  Result Value Ref Range   Prothrombin Time 16.2 (H) 11.4 - 15.2 seconds   INR 1.3 (H) 0.8 - 1.2  APTT  Result Value Ref Range   aPTT 44 (H) 24 - 36 seconds   DG Foot Complete Left  Result Date: 06/11/2021 CLINICAL DATA:  Gangrenous left foot. EXAM: LEFT FOOT - COMPLETE 3+ VIEW COMPARISON:  None. FINDINGS: There is no evidence of fracture or dislocation. There is no evidence of arthropathy or other focal bone abnormality. Vascular calcifications noted. Soft tissues are unremarkable. IMPRESSION: Vascular calcifications are seen.  The exam is otherwise negative. Electronically Signed   By: Drusilla Kanner M.D.   On: 06/11/2021 18:28        EKG None  Radiology DG Foot  Complete Left  Result Date: 06/11/2021 CLINICAL DATA:  Gangrenous left foot. EXAM: LEFT FOOT - COMPLETE 3+ VIEW COMPARISON:  None. FINDINGS: There is no evidence of fracture or dislocation.  There is no evidence of arthropathy or other focal bone abnormality. Vascular calcifications noted. Soft tissues are unremarkable. IMPRESSION: Vascular calcifications are seen.  The exam is otherwise negative. Electronically Signed   By: Drusilla Kanner M.D.   On: 06/11/2021 18:28    Procedures Procedures   Medications Ordered in ED Medications  piperacillin-tazobactam (ZOSYN) IVPB 3.375 g (has no administration in time range)  vancomycin (VANCOCIN) IVPB 1000 mg/200 mL premix (has no administration in time range)    ED Course  I have reviewed the triage vital signs and the nursing notes.  Pertinent labs & imaging results that were available during my care of the patient were reviewed by me and considered in my medical decision making (see chart for details).    MDM Rules/Calculators/A&P                           Iv ns. Stat labs and imaging.   Reviewed nursing notes and prior charts for additional history.   Labs reviewed/interpreted by me - lactate normal.   Xrays reviewed/interpreted by me - no obvious osteo.   Although pt does not appear septic, he is a diabetic with left foot/toe infection that is not resolving with tx two antibiotics, im rocephin and fluconazole (as outpatient). As such, will admit for iv antibiotics and wound care.   Zosyn iv, vanc iv.   Unassigned medicine consulted for admission.    Final Clinical Impression(s) / ED Diagnoses Final diagnoses:  None    Rx / DC Orders ED Discharge Orders     None        Cathren Laine, MD 06/12/21 [id number]

## 2021-06-12 NOTE — ED Notes (Signed)
Pt has been having high blood pressure. MD made aware.

## 2021-06-12 NOTE — Consult Note (Signed)
WOC Nurse Consult Note: Reason for Consult:LLE with skin changes consistent with PAD vs mixed etiology.  Dry gangrene to two digits at plantar aspect, thin brittle nails, dependent rubor. Wound type: Consistent with PAD vs mixed etiology Pressure Injury POA: N/A Measurement: Wound bed: stable dry gangrene to plantar aspect of two digits, dried area on anterior foot, recent history of edema, pain and weeping, also pretibial erythema Drainage (amount, consistency, odor) N/A Periwound: As noted above Dressing procedure/placement/frequency: I will provide pressure injury prevention for the bilateral heels using pressure redistribution heel boots and placement of a prophylactic sacral foam dressing. I communicated with Dr. Criselda Peaches and recommended an ABI and consideration of Vascular involvement.   WOC nursing team will not follow, but will remain available to this patient, the nursing and medical teams.  Please re-consult if needed. Thanks, Ladona Mow, MSN, RN, GNP, [place]  Pager# [phone]

## 2021-06-13 ENCOUNTER — Inpatient Hospital Stay (HOSPITAL_COMMUNITY): Payer: Medicare Other

## 2021-06-13 ENCOUNTER — Encounter (HOSPITAL_COMMUNITY): Payer: Self-pay | Admitting: Internal Medicine

## 2021-06-13 ENCOUNTER — Other Ambulatory Visit: Payer: Self-pay

## 2021-06-13 DIAGNOSIS — E11628 Type 2 diabetes mellitus with other skin complications: Secondary | ICD-10-CM | POA: Diagnosis not present

## 2021-06-13 DIAGNOSIS — L03116 Cellulitis of left lower limb: Secondary | ICD-10-CM

## 2021-06-13 DIAGNOSIS — E119 Type 2 diabetes mellitus without complications: Secondary | ICD-10-CM | POA: Diagnosis not present

## 2021-06-13 DIAGNOSIS — I70262 Atherosclerosis of native arteries of extremities with gangrene, left leg: Secondary | ICD-10-CM

## 2021-06-13 DIAGNOSIS — I70223 Atherosclerosis of native arteries of extremities with rest pain, bilateral legs: Secondary | ICD-10-CM | POA: Diagnosis not present

## 2021-06-13 DIAGNOSIS — L089 Local infection of the skin and subcutaneous tissue, unspecified: Secondary | ICD-10-CM | POA: Diagnosis not present

## 2021-06-13 DIAGNOSIS — M869 Osteomyelitis, unspecified: Secondary | ICD-10-CM

## 2021-06-13 DIAGNOSIS — L0390 Cellulitis, unspecified: Secondary | ICD-10-CM | POA: Diagnosis not present

## 2021-06-13 DIAGNOSIS — I10 Essential (primary) hypertension: Secondary | ICD-10-CM | POA: Diagnosis not present

## 2021-06-13 LAB — GLUCOSE, CAPILLARY
Glucose-Capillary: 111 mg/dL — ABNORMAL HIGH (ref 70–99)
Glucose-Capillary: 128 mg/dL — ABNORMAL HIGH (ref 70–99)
Glucose-Capillary: 141 mg/dL — ABNORMAL HIGH (ref 70–99)
Glucose-Capillary: 147 mg/dL — ABNORMAL HIGH (ref 70–99)
Glucose-Capillary: 149 mg/dL — ABNORMAL HIGH (ref 70–99)
Glucose-Capillary: 150 mg/dL — ABNORMAL HIGH (ref 70–99)
Glucose-Capillary: 155 mg/dL — ABNORMAL HIGH (ref 70–99)
Glucose-Capillary: 185 mg/dL — ABNORMAL HIGH (ref 70–99)

## 2021-06-13 LAB — CBC
HCT: 33.7 % — ABNORMAL LOW (ref 39.0–52.0)
Hemoglobin: 11.1 g/dL — ABNORMAL LOW (ref 13.0–17.0)
MCH: 29.1 pg (ref 26.0–34.0)
MCHC: 32.9 g/dL (ref 30.0–36.0)
MCV: 88.2 fL (ref 80.0–100.0)
Platelets: 239 10*3/uL (ref 150–400)
RBC: 3.82 MIL/uL — ABNORMAL LOW (ref 4.22–5.81)
RDW: 12.4 % (ref 11.5–15.5)
WBC: 8.5 10*3/uL (ref 4.0–10.5)
nRBC: 0 % (ref 0.0–0.2)

## 2021-06-13 LAB — BASIC METABOLIC PANEL
Anion gap: 8 (ref 5–15)
BUN: 19 mg/dL (ref 8–23)
CO2: 24 mmol/L (ref 22–32)
Calcium: 8.8 mg/dL — ABNORMAL LOW (ref 8.9–10.3)
Chloride: 106 mmol/L (ref 98–111)
Creatinine, Ser: 1.08 mg/dL (ref 0.61–1.24)
GFR, Estimated: >60 mL/min (ref >60)
Glucose, Bld: 159 mg/dL — ABNORMAL HIGH (ref 70–99)
Potassium: 4.2 mmol/L (ref 3.5–5.1)
Sodium: 138 mmol/L (ref 135–145)

## 2021-06-13 LAB — HEPARIN LEVEL (UNFRACTIONATED): Heparin Unfractionated: 0.53 IU/mL (ref 0.30–0.70)

## 2021-06-13 LAB — APTT: aPTT: 52 s — ABNORMAL HIGH (ref 24–36)

## 2021-06-13 MED ORDER — MELATONIN 3 MG PO TABS
3.0000 mg | ORAL_TABLET | Freq: Every evening | ORAL | Status: DC | PRN
  Administered 2021-06-13 – 2021-06-17 (×5): 3 mg via ORAL
  Filled 2021-06-13 (×5): qty 1

## 2021-06-13 MED ORDER — SODIUM CHLORIDE 0.9 % IV SOLN
2.0000 g | Freq: Once | INTRAVENOUS | Status: AC
  Administered 2021-06-13: 2 g via INTRAVENOUS
  Filled 2021-06-13: qty 2

## 2021-06-13 MED ORDER — METRONIDAZOLE 500 MG/100ML IV SOLN
500.0000 mg | Freq: Three times a day (TID) | INTRAVENOUS | Status: DC
  Administered 2021-06-13 – 2021-06-18 (×16): 500 mg via INTRAVENOUS
  Filled 2021-06-13 (×17): qty 100

## 2021-06-13 MED ORDER — SODIUM CHLORIDE 0.9% FLUSH
10.0000 mL | Freq: Two times a day (BID) | INTRAVENOUS | Status: DC
  Administered 2021-06-13 – 2021-06-23 (×11): 10 mL

## 2021-06-13 MED ORDER — HEPARIN (PORCINE) 25000 UT/250ML-% IV SOLN
1650.0000 [IU]/h | INTRAVENOUS | Status: AC
  Administered 2021-06-13: 1500 [IU]/h via INTRAVENOUS
  Administered 2021-06-14: 1700 [IU]/h via INTRAVENOUS
  Filled 2021-06-13 (×3): qty 250

## 2021-06-13 MED ORDER — TRAMADOL HCL 50 MG PO TABS
50.0000 mg | ORAL_TABLET | Freq: Four times a day (QID) | ORAL | Status: DC | PRN
Start: 2021-06-13 — End: 2021-06-19
  Administered 2021-06-13 – 2021-06-18 (×6): 50 mg via ORAL
  Filled 2021-06-13 (×6): qty 1

## 2021-06-13 MED ORDER — SODIUM CHLORIDE 0.9 % IV SOLN
2.0000 g | Freq: Three times a day (TID) | INTRAVENOUS | Status: DC
  Administered 2021-06-13 – 2021-06-16 (×9): 2 g via INTRAVENOUS
  Filled 2021-06-13 (×12): qty 2

## 2021-06-13 NOTE — Progress Notes (Addendum)
Pharmacy Antibiotic Note  Cameron Ruiz is a 87 y.o. male admitted on 06/11/2021 with diabetic foot infection. Patient also has osteomyelitis per MRI findings. Pharmacy has been consulted for cefepime dosing. Keflex allergy on file, but patient has been prescribed Augmentin in the past and tolerated Rocephin when seen outpatient. SCr is 1.08 with an estimated CrCl of 58.42ml/min. Renal function is improving. WBC 8.9, patient afebrile.   Plan: Start cefepime 2g q8h Start metronidazole 500mg  q8h  Continue vancomycin 1,000mg  q24h   Height: 6\' 1"  (185.4 cm) (stated) Weight: 99.8 kg (220 lb 0.3 oz) IBW/kg (Calculated) : 79.9  Temp (24hrs), Avg:97.7 F (36.5 C), Min:97.4 F (36.3 C), Max:98 F (36.7 C)  Recent Labs  Lab 06/11/21 1732 06/11/21 1830 06/12/21 0830 06/12/21 1022 06/13/21 0157  WBC 7.0  --   --  8.1 8.5  CREATININE 1.42*  --   --  1.22 1.08  LATICACIDVEN  --  0.8 1.1  --   --     Estimated Creatinine Clearance: 58.8 mL/min (by C-G formula based on SCr of 1.08 mg/dL).    Allergies  Allergen Reactions   Carbocaine [Mepivacaine]    Cogentin [Benztropine]    Fluogen [Influenza Virus Vaccine]     Flu & pneumonia vaccine   Keflex [Cephalexin]    Kenalog [Triamcinolone]    Levaquin [Levofloxacin]     Antimicrobials this admission: Zosyn x1 in ED Vancomycin 7/30 >> Cefepime 7/31>>  Microbiology results: 7/29 BCx X2: NGTD  Thank you for allowing pharmacy to be a part of this patient's care.  8/30 06/13/2021 9:17 AM

## 2021-06-13 NOTE — Progress Notes (Signed)
Phlebotomist already got blood specimen when got there. Heparin was infusing through the midline, therefore couldn't get blood work from there neither. Explained patient's RN to switch heparin infusion the other PIV access, so can get blood specimen from midline. RN understood it well. HS [employer]

## 2021-06-13 NOTE — Consult Note (Addendum)
ASSESSMENT & PLAN   PVD WITH GANGRENE LEFT THIRD TOE: This patient has extensive wounds on the left foot with severe infrainguinal arterial occlusive disease.  He is at very high risk for limb loss.  He feels strongly about wanting to save the limb if at all possible.  I have recommended arteriography to further evaluate his options for revascularization.  Hopefully he would have an option from an endovascular standpoint given his age as he would be very high risk for open surgery.  I will tentatively schedule this case for Tuesday although it is possible we get added onto the schedule tomorrow late in the day.  I have reviewed with the patient the indications for arteriography. In addition, I have reviewed the potential complications of arteriography including but not limited to: Bleeding, arterial injury, arterial thrombosis, dye action, renal insufficiency, or other unpredictable medical problems. I have explained to the patient that if we find disease amenable to angioplasty we could potentially address this at the same time. I have discussed the potential complications of angioplasty and stenting, including but not limited to: Bleeding, arterial thrombosis, arterial injury, dissection, or the need for surgical intervention.  He is on aspirin (81 mg) and a statin.  ANTICOAGULATION: His home meds included Eliquis.  It looks like this is being held and is on intravenous heparin.  His intravenous heparin would have to be held prior to his arteriogram.  REASON FOR CONSULT:    Peripheral vascular disease with gangrene of left third toe.  The consult is requested by Dr. Austin Miles.  HPI:   Cameron Ruiz is a 87 y.o. male who was admitted with cellulitis of the left leg and dry gangrene on the toes of the left foot.  He underwent an MRI which shows osteomyelitis of the left third toe.  He had arterial Doppler studies which showed evidence of severe peripheral arterial disease on the left and for  this reason vascular surgery was consulted.  His risk factors for peripheral vascular disease include type 2 diabetes, hypertension, and remote history of tobacco use.  He denies any history of hypercholesterolemia, or family history of premature cardiovascular disease.  He underwent coronary revascularization in the remote past.  He does admit to some calf claudication bilaterally which has been going on for about 3 to 4 years.  He denies any history of rest pain.  Its not clear to him how he developed wounds on the left foot.  He denies any previous history of myocardial infarction or history of congestive heart failure.  He said no recent chest pain.  History reviewed. No pertinent past medical history.  History reviewed. No pertinent family history.  SOCIAL HISTORY: Social History   Tobacco Use   Smoking status: Former    Types: Cigarettes    Passive exposure: Never   Smokeless tobacco: Never  Substance Use Topics   Alcohol use: Not Currently    Allergies  Allergen Reactions   Carbocaine [Mepivacaine]    Cogentin [Benztropine]    Fluogen [Influenza Virus Vaccine]     Flu & pneumonia vaccine   Keflex [Cephalexin]    Kenalog [Triamcinolone]    Levaquin [Levofloxacin]     Current Facility-Administered Medications  Medication Dose Route Frequency Provider Last Rate Last Admin   acetaminophen (TYLENOL) tablet 650 mg  650 mg Oral Q6H PRN Dellia Cloud, MD       Or   acetaminophen (TYLENOL) suppository 650 mg  650 mg Rectal Q6H PRN Dellia Cloud, MD  carvedilol (COREG) tablet 6.25 mg  6.25 mg Oral BID Dellia Cloud, MD   6.25 mg at 06/13/21 1034   ceFEPIme (MAXIPIME) 2 g in sodium chloride 0.9 % 100 mL IVPB  2 g Intravenous Q8H Georgeann Oppenheim, [facility]       heparin ADULT infusion 100 units/mL (25000 units/253mL)  1,500 Units/hr Intravenous Continuous Georgeann Oppenheim, RPH 15 mL/hr at 06/13/21 1134 1,500 Units/hr at 06/13/21 1134   hydrALAZINE (APRESOLINE) tablet 100 mg   100 mg Oral TID Dellia Cloud, MD   100 mg at 06/13/21 1034   insulin aspart (novoLOG) injection 0-9 Units  0-9 Units Subcutaneous TID WC Dellia Cloud, MD   1 Units at 06/13/21 1136   losartan (COZAAR) tablet 100 mg  100 mg Oral Daily Dellia Cloud, MD   100 mg at 06/13/21 1034   metroNIDAZOLE (FLAGYL) IVPB 500 mg  500 mg Intravenous Q8H Georgeann Oppenheim, RPH 100 mL/hr at 06/13/21 1135 500 mg at 06/13/21 1135   pantoprazole (PROTONIX) EC tablet 40 mg  40 mg Oral Daily Dellia Cloud, MD   40 mg at 06/13/21 1034   simvastatin (ZOCOR) tablet 20 mg  20 mg Oral QHS Dellia Cloud, MD   20 mg at 06/12/21 2052   sodium chloride flush (NS) 0.9 % injection 10-40 mL  10-40 mL Intracatheter Q12H Debe Coder B, MD       sodium chloride flush (NS) 0.9 % injection 3 mL  3 mL Intravenous Q12H Dellia Cloud, MD   3 mL at 06/13/21 1039   torsemide (DEMADEX) tablet 10 mg  10 mg Oral q morning Dellia Cloud, MD   10 mg at 06/13/21 1129   vancomycin (VANCOCIN) IVPB 1000 mg/200 mL premix  1,000 mg Intravenous Q24H Carney, Gwenlyn Found, RPH 200 mL/hr at 06/13/21 1255 1,000 mg at 06/13/21 1255    REVIEW OF SYSTEMS:  [X]  denotes positive finding, [ ]  denotes negative finding Cardiac  Comments:  Chest pain or chest pressure:    Shortness of breath upon exertion:    Short of breath when lying flat:    Irregular heart rhythm:        Vascular    Pain in calf, thigh, or hip brought on by ambulation: x   Pain in feet at night that wakes you up from your sleep:     Blood clot in your veins:    Leg swelling:         Pulmonary    Oxygen at home:    Productive cough:     Wheezing:         Neurologic    Sudden weakness in arms or legs:     Sudden numbness in arms or legs:     Sudden onset of difficulty speaking or slurred speech:    Temporary loss of vision in one eye:     Problems with dizziness:         Gastrointestinal    Blood in stool:     Vomited blood:         Genitourinary    Burning when urinating:      Blood in urine:        Psychiatric    Major depression:         Hematologic    Bleeding problems:    Problems with blood clotting too easily:        Skin    Rashes or ulcers: x       Constitutional  Fever or chills:    -  PHYSICAL EXAM:   Vitals:   06/13/21 0001 06/13/21 0437 06/13/21 0805 06/13/21 1201  BP: (!) 143/57 (!) 156/61 (!) 153/50 (!) 145/75  Pulse: 62 63 63 64  Resp: 18 18 18 19   Temp: 98 F (36.7 C) (!) 97.4 F (36.3 C) 97.6 F (36.4 C) 98.8 F (37.1 C)  TempSrc: Oral Oral Oral Oral  SpO2: 98% 95% 97% 98%  Weight:      Height:       Body mass index is 29.03 kg/m. GENERAL: The patient is a well-nourished male, in no acute distress. The vital signs are documented above. CARDIAC: There is a regular rate and rhythm.  VASCULAR: I do not detect carotid bruits. He has palpable femoral pulses. I cannot palpate popliteal or pedal pulses. PULMONARY: There is good air exchange bilaterally without wheezing or rales. ABDOMEN: Soft and non-tender with normal pitched bowel sounds.  I do not palpate an aneurysm. MUSCULOSKELETAL: There are no major deformities. NEUROLOGIC: No focal weakness or paresthesias are detected. SKIN: He has extensive wounds on the left foot as documented in the photographs below.  He has dry gangrene on the first second and third toes of the left foot.  He has a wound on the dorsum of his foot that may be full-thickness.     He also has a small wound on his right second toe.   PSYCHIATRIC: The patient has a normal affect.  DATA:    ARTERIAL DOPPLER STUDY: I have independently interpreted his arterial Doppler study today.  On the right side there is a monophasic dorsalis pedis and posterior tibial signal.  ABIs 75%.  Toe pressures 41 mmHg.  On the left side there is no posterior tibial signal.  There is a dampened monophasic dorsalis pedis signal.  ABI is 34%.  Toe pressure is 0.  LABS: His GFR is greater than 60.  Creatinine  1.08.  MRI LEFT FOOT: I reviewed the MRI of his left foot which shows marrow edema throughout the distal phalanx of the third toe on the left compatible with osteomyelitis.  There is no abscess.   [facility]

## 2021-06-13 NOTE — Progress Notes (Addendum)
   Subjective:   No acute overnight events.  Patient reports feeling better this morning.  Right foot does not hurt at all and left foot is somewhat sore.  Otherwise no other complaints.  Discussed plan to obtain ABIs to guide management, he agrees.  ADDENDUM: ABIs showing moderate right lower extremity vascular disease and severe LLE vascular disease. Consulted vascular surgery, Dr. Edilia Bo, who will see patient.  Objective:  Vital signs in last 24 hours: Vitals:   06/12/21 2231 06/13/21 0001 06/13/21 0437 06/13/21 0805  BP:  (!) 143/57 (!) 156/61 (!) 153/50  Pulse:  62 63 63  Resp:  18 18 18   Temp:  98 F (36.7 C) (!) 97.4 F (36.3 C) 97.6 F (36.4 C)  TempSrc:  Oral Oral Oral  SpO2:  98% 95% 97%  Weight: 99.8 kg     Height: 6\' 1"  (1.854 m)      Physical exam: General: Elderly male, lying in bed, NAD. CV: Regular rate and normal rhythm, no murmurs rubs or gallops. Pulm: Normal work of breathing on room air, no respiratory distress noted. Abdomen: Soft, nontender, nondistended, positive bowel sounds. Extremities: Left foot with visible dry gangrenous changes over toes and dorsal surface of the left foot.  Erythema on anterior left shin appears improved.  Left foot is cold to the touch and dorsalis pedis pulse is nonpalpable. Neuro: Alert and oriented x3, no focal deficits noted.   Assessment/Plan:  Active Problems:   Cellulitis  LLE cellulitis - improving Left foot third digit osteomyelitis Patient presented with left foot cellulitis and dry gangrenous changes on left toes, found to have osteomyelitis of left foot third digit seen on MRI.  Foot infection is complicated by diabetes and vascular disease.  He has failed outpatient antibiotic and antifungal treatment.  Patient is on antibiotic coverage with IV vancomycin.  Today, we will add cefepime for pseudomonal coverage and metronidazole for anaerobic coverage.  Awaiting ABIs to assess perfusion prior to consulting  vascular surgery versus orthopedics for further evaluation and management. -Continue IV vancomycin -Added cefepime and Flagyl -Acetaminophen 650 mg every 6 hours as needed for pain -Blood cultures pending -ABIs pending  Atrial fibrillation On Eliquis at home.  Placed on heparin drip while here in case for surgery. -Continue heparin drip  CAD status post CABG CHF Per patient, had open heart surgery for CABG approximately 20 years ago.  Patient on torsemide 10 mg daily at home. -Continue home aspirin 81 mg daily, nitroglycerin 0.4 mg as needed, simvastatin 20 mg daily -Continue home torsemide  Hypertension Hyperlipidemia -Continue home hydralazine 100 mg TID, losartan 100 mg daily, Coreg 6.25 mg BID -Continue home simvastatin 20 mg daily  Type 2 diabetes mellitus Patient on NovoLog 70/30 at home, takes 20 units in the morning and 20 units at night.  Glucose levels well controlled on SSI while here. -Continue SSI  Prior to Admission Living Arrangement: Home Anticipated Discharge Location: TBD Barriers to Discharge: continued medical management Dispo: Anticipated discharge in approximately 3-4 day(s).   , MD 06/13/2021, 11:30 AM Pager: [phone] After 5pm on weekdays and 1pm on weekends: On Call pager [phone]

## 2021-06-13 NOTE — Progress Notes (Signed)
ANTICOAGULATION CONSULT NOTE   Pharmacy Consult for heparin dosing Indication: atrial fibrillation  Allergies  Allergen Reactions   Carbocaine [Mepivacaine]    Cogentin [Benztropine]    Fluogen [Influenza Virus Vaccine]     Flu & pneumonia vaccine   Keflex [Cephalexin]    Kenalog [Triamcinolone]    Levaquin [Levofloxacin]     Patient Measurements: Height: 6\' 1"  (185.4 cm) (stated) Weight: 99.8 kg (220 lb 0.3 oz) IBW/kg (Calculated) : 79.9 Heparin Dosing Weight: 99.8 kg  Vital Signs: Temp: 98 F (36.7 C) (07/31 1709) Temp Source: Oral (07/31 1709) BP: 122/52 (07/31 1709) Pulse Rate: 55 (07/31 1709)  Labs: Recent Labs    06/11/21 1732 06/12/21 1022 06/12/21 2016 06/13/21 0157 06/13/21 1851  HGB 13.6 10.9*  --  11.1*  --   HCT 42.4 34.1*  --  33.7*  --   PLT 202 239  --  239  --   APTT 44*  --   --   --  52*  LABPROT 16.2*  --  15.3*  --   --   INR 1.3*  --  1.2  --   --   HEPARINUNFRC  --   --   --   --  0.53  CREATININE 1.42* 1.22  --  1.08  --      Estimated Creatinine Clearance: 58.8 mL/min (by C-G formula based on SCr of 1.08 mg/dL).   Medical History: History reviewed. No pertinent past medical history.  Medications:  Scheduled:   carvedilol  6.25 mg Oral BID   hydrALAZINE  100 mg Oral TID   insulin aspart  0-9 Units Subcutaneous TID WC   losartan  100 mg Oral Daily   pantoprazole  40 mg Oral Daily   simvastatin  20 mg Oral QHS   sodium chloride flush  10-40 mL Intracatheter Q12H   sodium chloride flush  3 mL Intravenous Q12H   torsemide  10 mg Oral q morning   Infusions:   ceFEPime (MAXIPIME) IV 2 g (06/13/21 1430)   heparin 1,500 Units/hr (06/13/21 1134)   metronidazole 500 mg (06/13/21 1135)   vancomycin 1,000 mg (06/13/21 1255)    Assessment: 87 year old male presenting for diabetic foot infection with osteomyelitis. History of atrial fibrillation on PTA Eliquis. Has not received any Eliquis in hospital and last dose unknown. SCr 1.08  with CrCl of 58.8 ml/min. Hgb 11.1, HCT 33.7, PLT 239.   Aptt this evening slightly below goal.  No known issues with IV infusion, no bleeding or complications noted.  Heparin level appears a little falsely elevated from recent Eliquis.  Goal of Therapy:  Heparin level 0.3-0.7 units/ml aPTT 66-102 seconds Monitor platelets by anticoagulation protocol: Yes   Plan:  Increase IV heparin to 1700 units/hr. Repeat heparin level, aPTT and CBC with AM labs.  98, Cameron Ruiz, BCCP Clinical Pharmacist  06/13/2021 8:07 PM   [facility] pharmacy phone numbers are listed on [email or web address]

## 2021-06-13 NOTE — Progress Notes (Signed)
  Date: 06/13/2021  Patient name: Cameron Ruiz  Medical record number: [id number]  Date of birth: 1931/09/05   I have seen and evaluated Lumb Heir and discussed their care with the Residency Team. Briefly, Mr. Gander is an 87 year old man with PMH of DM, CAD s/p CABG, HTN, HLD, blindness who presents for wounds on the left foot and mild pain.  He has decreased sensation of the foot and decreased vision which have made it difficult for him to appreciate the extent of the disease.  He has taken 2 courses of antibiotics over the past few weeks, but his podiatrist advised he come to the ED once the foot got worse.  He denied fever and chills.  Lives with his wife.   Vitals:   06/13/21 0437 06/13/21 0805  BP: (!) 156/61 (!) 153/50  Pulse: 63 63  Resp: 18 18  Temp: (!) 97.4 F (36.3 C) 97.6 F (36.4 C)  SpO2: 95% 97%   Gen: Lying in bed, generally in a good mood Eyes: Anicteric sclerae CV: RR, NR, no murmur noted, no peripheral edema, he has non palpable pulse in the left DP and very thready pulse in the right DP Pulm: Breathing comfortably on room air, no wheezing Abd: Soft, +BS MSK: Normal tone and bulk Skin: He has dusky 1st and 3rd toe, he has chronic wounds and skin changes to the ankle, he has regression of erythema today from previous line of demarcation.  He has eschar formation over the 1st and 2nd toe pad.  Pictures available in media.  Psych: Pleasant, normal mood.   Assessment and Plan: I have seen and evaluated the patient as outlined above. I agree with the formulated Assessment and Plan as detailed in the residents' note, with the following changes:   1. OM of the third digit of the left foot, cellulitis, onychomycosis - ABI's are planned for today - Continue Cefepime and Vancomycin today - Follow up blood cultures.  - Tylenol for pain  Other issues per Dr. Everardo Pacific daily note.   Inez Catalina, MD 7/31/202210:38 AM

## 2021-06-13 NOTE — Progress Notes (Signed)
ANTICOAGULATION CONSULT NOTE - Initial Consult  Pharmacy Consult for heparin dosing Indication: atrial fibrillation  Allergies  Allergen Reactions   Carbocaine [Mepivacaine]    Cogentin [Benztropine]    Fluogen [Influenza Virus Vaccine]     Flu & pneumonia vaccine   Keflex [Cephalexin]    Kenalog [Triamcinolone]    Levaquin [Levofloxacin]     Patient Measurements: Height: 6\' 1"  (185.4 cm) (stated) Weight: 99.8 kg (220 lb 0.3 oz) IBW/kg (Calculated) : 79.9 Heparin Dosing Weight: 99.8 kg  Vital Signs: Temp: 97.6 F (36.4 C) (07/31 0805) Temp Source: Oral (07/31 0805) BP: 153/50 (07/31 0805) Pulse Rate: 63 (07/31 0805)  Labs: Recent Labs    06/11/21 1732 06/12/21 1022 06/12/21 2016 06/13/21 0157  HGB 13.6 10.9*  --  11.1*  HCT 42.4 34.1*  --  33.7*  PLT 202 239  --  239  APTT 44*  --   --   --   LABPROT 16.2*  --  15.3*  --   INR 1.3*  --  1.2  --   CREATININE 1.42* 1.22  --  1.08    Estimated Creatinine Clearance: 58.8 mL/min (by C-G formula based on SCr of 1.08 mg/dL).   Medical History: History reviewed. No pertinent past medical history.  Medications:  Scheduled:   carvedilol  6.25 mg Oral BID   hydrALAZINE  100 mg Oral TID   insulin aspart  0-9 Units Subcutaneous TID WC   losartan  100 mg Oral Daily   pantoprazole  40 mg Oral Daily   simvastatin  20 mg Oral QHS   sodium chloride flush  3 mL Intravenous Q12H   torsemide  10 mg Oral q morning   Infusions:   ceFEPime (MAXIPIME) IV     ceFEPime (MAXIPIME) IV     vancomycin      Assessment: 87 year old male presenting for diabetic foot infection with osteomyelitis. History of atrial fibrillation on PTA Eliquis. Has not received any Eliquis in hospital and last dose unknown. SCr 1.08 with CrCl of 58.8 ml/min. Hgb 11.1, HCT 33.7, PLT 239.   Goal of Therapy:  Heparin level 0.3-0.7 units/ml aPTT 66-102 seconds Monitor platelets by anticoagulation protocol: Yes   Plan:  Start heparin infusion at  1,500 units/hr Check anti-Xa level and aPTT in 8 hours and daily while on heparin. Stop checking aPTT levels once they start correlating with anti-Xa level.  Continue to monitor H&H and platelets  98, PharmD Pharmacy Resident 06/13/2021, 9:42 AM

## 2021-06-13 NOTE — Evaluation (Signed)
Physical Therapy Evaluation Patient Details Name: Cameron Ruiz MRN: [id number] DOB: 22-Jul-1931 Today's Date: 06/13/2021   History of Present Illness  MEHDI GIRONDA is a 87 y.o. male admitted on 06/11/2021 with osteomyelitis of left foot wound. PMH includes diabetes on insulin, cardiovascular disease s/p CABG, HTN, and HLD.  Clinical Impression  Pt admitted with above diagnosis and presents to PT with functional limitations due to deficits listed below (See PT problem list). Pt needs skilled PT to maximize independence and safety to allow discharge to home with family support. If any medical interventions result in decline in mobility may need to look at other dc options.      Follow Up Recommendations Home health PT;Supervision for mobility/OOB    Equipment Recommendations  None recommended by PT    Recommendations for Other Services       Precautions / Restrictions Precautions Precautions: Other (comment) Precaution Comments: poor vision      Mobility  Bed Mobility Overal bed mobility: Needs Assistance Bed Mobility: Supine to Sit     Supine to sit: Supervision;HOB elevated     General bed mobility comments: incr time and heavy use of rail    Transfers Overall transfer level: Needs assistance Equipment used: Rolling walker (2 wheeled) Transfers: Sit to/from Stand Sit to Stand: Min assist         General transfer comment: assist to bring hips up and for balance  Ambulation/Gait Ambulation/Gait assistance: Min assist Gait Distance (Feet): 90 Feet Assistive device: Rolling walker (2 wheeled) Gait Pattern/deviations: Step-to pattern;Decreased stride length;Trunk flexed Gait velocity: decr Gait velocity interpretation: <1.31 ft/sec, indicative of household ambulator General Gait Details: Assist for balance and verbal cue stay closer to walker. Pt is used to using a standard walker so was picking up rolling walker. Knees flexed throughout.  Stairs             Wheelchair Mobility    Modified Rankin (Stroke Patients Only)       Balance Overall balance assessment: Needs assistance Sitting-balance support: No upper extremity supported;Feet unsupported Sitting balance-Leahy Scale: Fair     Standing balance support: Bilateral upper extremity supported Standing balance-Leahy Scale: Poor Standing balance comment: walker and min guard for static standing                             Pertinent Vitals/Pain Pain Assessment: Faces Faces Pain Scale: Hurts a little bit Pain Location: generalized Pain Descriptors / Indicators: Sore Pain Intervention(s): Limited activity within patient's tolerance    Home Living Family/patient expects to be discharged to:: Private residence Living Arrangements: Spouse/significant other (son and granddaughter live near by and assist as needed) Available Help at Discharge: Family;Available 24 hours/day Type of Home: House Home Access: Stairs to enter   [employer] of Steps: 3 Home Layout: One level Home Equipment: Environmental consultant - 2 wheels Additional Comments: wife recieves home heath therapies    Prior Function Level of Independence: Independent with assistive device(s)         Comments: pt uses a standard walker for ambulation, he sponge bathes at the sink     Hand Dominance   Dominant Hand: Right    Extremity/Trunk Assessment   Upper Extremity Assessment Upper Extremity Assessment: Defer to OT evaluation    Lower Extremity Assessment Lower Extremity Assessment: Generalized weakness    Cervical / Trunk Assessment Cervical / Trunk Assessment: Kyphotic  Communication   Communication: No difficulties  Cognition Arousal/Alertness: Awake/alert  Behavior During Therapy: WFL for tasks assessed/performed Overall Cognitive Status: Within Functional Limits for tasks assessed                                        General Comments      Exercises      Assessment/Plan    PT Assessment Patient needs continued PT services  PT Problem List Decreased strength;Decreased balance;Decreased mobility;Decreased activity tolerance       PT Treatment Interventions DME instruction;Functional mobility training;Therapeutic activities;Gait training;Stair training;Therapeutic exercise;Balance training;Patient/family education    PT Goals (Current goals can be found in the Care Plan section)  Acute Rehab PT Goals Patient Stated Goal: return home PT Goal Formulation: With patient Time For Goal Achievement: 06/27/21 Potential to Achieve Goals: Good    Frequency Min 3X/week   Barriers to discharge Inaccessible home environment stairs to enter    Co-evaluation               AM-PAC PT "6 Clicks" Mobility  Outcome Measure Help needed turning from your back to your side while in a flat bed without using bedrails?: None Help needed moving from lying on your back to sitting on the side of a flat bed without using bedrails?: A Little Help needed moving to and from a bed to a chair (including a wheelchair)?: A Little Help needed standing up from a chair using your arms (e.g., wheelchair or bedside chair)?: A Little Help needed to walk in hospital room?: A Little Help needed climbing 3-5 steps with a railing? : A Little 6 Click Score: 19    End of Session Equipment Utilized During Treatment: Gait belt Activity Tolerance: Patient tolerated treatment well Patient left: in chair;with call bell/phone within reach Nurse Communication: Mobility status PT Visit Diagnosis: Unsteadiness on feet (R26.81);Muscle weakness (generalized) (M62.81);Difficulty in walking, not elsewhere classified (R26.2)    Time: 7106-2694 PT Time Calculation (min) (ACUTE ONLY): 22 min   Charges:   PT Evaluation $PT Eval Moderate Complexity: 1 Mod          [facility] PT Acute Rehabilitation Services Pager [phone] Office [phone]   Angelina Ok  [facility] 06/13/2021, 3:42 PM

## 2021-06-13 NOTE — Progress Notes (Signed)
VASCULAR LAB    ABIs have been performed.  See CV proc for preliminary results.   Azariah Latendresse, RVT 06/13/2021, 10:43 AM

## 2021-06-13 NOTE — Progress Notes (Signed)
Occupational Therapy Evaluation Patient Details Name: Cameron Ruiz MRN: [id number] DOB: 23-Aug-1931 Today's Date: 06/13/2021    History of Present Illness Cameron Ruiz is a 87 y.o. male admitted on 06/11/2021 with osteomyelitis of left foot wound. PMH includes diabetes on insulin, cardiovascular disease s/p CABG, HTN, and HLD.   Clinical Impression   Cameron Ruiz was evaluated s/p the above impairments. PTA pt was mod I for all ADLs, and his wife completes all IADLs including medication management. Pt lives in a 1 level home with 3 STE. At baseline, his vision is severely impaired, per reports he sees shadows and silhouettes only. Upon evaluation, pt is mod I for bed mobility, min A for sit<>stand and min guard for short room distance ambulation with rw. Upper body ADLs are at a set up level and lower body ADLs are min A globally. Pt would benefit from continued OT acutely to maximize indep prior to d/c. Recommend d/c to home with HHOT.     Follow Up Recommendations  Home health OT;Supervision - Intermittent    Equipment Recommendations  3 in 1 bedside commode;Tub/shower bench       Precautions / Restrictions Precautions Precautions: Fall Restrictions Weight Bearing Restrictions: No      Mobility Bed Mobility Overal bed mobility: Modified Independent             General bed mobility comments: +incrased time and use of bed rail    Transfers Overall transfer level: Needs assistance Equipment used: Rolling walker (2 wheeled) Transfers: Sit to/from [employer] Sit to Stand: Min assist Stand pivot transfers: Min guard       General transfer comment: min A to boost into standing. pt required 2 attempts to successfully stand    Balance Overall balance assessment: Needs assistance Sitting-balance support: Feet supported Sitting balance-Leahy Scale: Fair     Standing balance support: Bilateral upper extremity supported Standing balance-Leahy Scale:  Poor                             ADL either performed or assessed with clinical judgement   ADL Overall ADL's : Needs assistance/impaired Eating/Feeding: Independent;Sitting   Grooming: Wash/dry hands;Wash/dry face;Oral care;Applying deodorant;Brushing hair;Set up;Sitting   Upper Body Bathing: Set up;Sitting   Lower Body Bathing: Minimal assistance;Sit to/from stand   Upper Body Dressing : Set up;Sitting   Lower Body Dressing: Minimal assistance;Sit to/from stand   Toilet Transfer: Min guard;RW;Ambulation   Toileting- Architect and Hygiene: Supervision/safety;Sitting/lateral lean       Functional mobility during ADLs: Min guard;Rolling walker;Cueing for safety General ADL Comments: min guard for safety, pt with forward flexion standing posture     Vision Baseline Vision/History:  (pt reports that he only sees "shadows" & his visual impairment has been progressing over 10 years however his is unsure of his dx.) Patient Visual Report: No change from baseline Vision Assessment?: Vision impaired- to be further tested in functional context            Pertinent Vitals/Pain Pain Assessment: Faces Faces Pain Scale: Hurts a little bit Pain Location: generalized and L foot pain with weight bearing Pain Descriptors / Indicators: Discomfort Pain Intervention(s): Monitored during session     Hand Dominance Right   Extremity/Trunk Assessment Upper Extremity Assessment Upper Extremity Assessment: Generalized weakness   Lower Extremity Assessment Lower Extremity Assessment: Defer to PT evaluation   Cervical / Trunk Assessment Cervical / Trunk Assessment: Kyphotic   Communication  Communication Communication: No difficulties   Cognition Arousal/Alertness: Awake/alert Behavior During Therapy: WFL for tasks assessed/performed Overall Cognitive Status: Within Functional Limits for tasks assessed           General Comments  tp reports feeling much  better today. His vision is severly impaired at baseline.     Home Living Family/patient expects to be discharged to:: Private residence Living Arrangements: Spouse/significant other (son and daughter live near by & assist as needed) Available Help at Discharge: Family;Available 24 hours/day Type of Home: House Home Access: Stairs to enter [employer] of Steps: 3   Home Layout: One level     Bathroom Shower/Tub: Chief Strategy Officer: Standard Bathroom Accessibility: Yes How Accessible: Accessible via walker Home Equipment: Walker - standard;Other (comment) (pt's wife has wc and rw)   Additional Comments: wife recieves home heath therapies      Prior Functioning/Environment Level of Independence: Independent with assistive device(s)        Comments: pt uses a standard walker for ambulation, he sponge bathes at the sink        OT Problem List: Decreased strength;Decreased range of motion;Decreased activity tolerance;Impaired balance (sitting and/or standing);Decreased knowledge of use of DME or AE;Decreased safety awareness;Pain      OT Treatment/Interventions: Self-care/ADL training;Therapeutic exercise;Therapeutic activities;Patient/family education;Balance training;DME and/or AE instruction    OT Goals(Current goals can be found in the care plan section) Acute Rehab OT Goals Patient Stated Goal: home soon OT Goal Formulation: With patient Time For Goal Achievement: 06/27/21 Potential to Achieve Goals: Fair ADL Goals Pt Will Perform Grooming: with modified independence;standing Pt Will Perform Upper Body Bathing: with modified independence;sitting Pt Will Perform Lower Body Bathing: with modified independence;sitting/lateral leans Pt Will Perform Upper Body Dressing: with modified independence;sitting Pt Will Perform Lower Body Dressing: with modified independence;sit to/from stand Pt Will Transfer to Toilet: with modified  independence;ambulating;grab bars Pt Will Perform Toileting - Clothing Manipulation and hygiene: with modified independence;sitting/lateral leans  OT Frequency: Min 2X/week    AM-PAC OT "6 Clicks" Daily Activity     Outcome Measure Help from another person eating meals?: None Help from another person taking care of personal grooming?: A Little Help from another person toileting, which includes using toliet, bedpan, or urinal?: A Little Help from another person bathing (including washing, rinsing, drying)?: A Little Help from another person to put on and taking off regular upper body clothing?: None Help from another person to put on and taking off regular lower body clothing?: A Little 6 Click Score: 20   End of Session Equipment Utilized During Treatment: Gait belt;Rolling walker Nurse Communication: Mobility status;Precautions;Weight bearing status  Activity Tolerance: Patient tolerated treatment well Patient left: in chair;with call bell/phone within reach  OT Visit Diagnosis: Unsteadiness on feet (R26.81);Muscle weakness (generalized) (M62.81);Pain                Time: 3875-6433 OT Time Calculation (min): 17 min Charges:  OT General Charges $OT Visit: 1 Visit OT Evaluation $OT Eval Moderate Complexity: 1 Mod   Ndidi Nesby A Brenn Gatton 06/13/2021, 9:40 AM

## 2021-06-14 DIAGNOSIS — I70262 Atherosclerosis of native arteries of extremities with gangrene, left leg: Secondary | ICD-10-CM | POA: Diagnosis not present

## 2021-06-14 DIAGNOSIS — L03116 Cellulitis of left lower limb: Secondary | ICD-10-CM | POA: Diagnosis not present

## 2021-06-14 DIAGNOSIS — L8990 Pressure ulcer of unspecified site, unspecified stage: Secondary | ICD-10-CM | POA: Diagnosis present

## 2021-06-14 LAB — CBC
HCT: 32.5 % — ABNORMAL LOW (ref 39.0–52.0)
Hemoglobin: 10.8 g/dL — ABNORMAL LOW (ref 13.0–17.0)
MCH: 29.4 pg (ref 26.0–34.0)
MCHC: 33.2 g/dL (ref 30.0–36.0)
MCV: 88.6 fL (ref 80.0–100.0)
Platelets: 237 10*3/uL (ref 150–400)
RBC: 3.67 MIL/uL — ABNORMAL LOW (ref 4.22–5.81)
RDW: 12.4 % (ref 11.5–15.5)
WBC: 8.7 10*3/uL (ref 4.0–10.5)
nRBC: 0 % (ref 0.0–0.2)

## 2021-06-14 LAB — HEPARIN LEVEL (UNFRACTIONATED)
Heparin Unfractionated: >1.1 IU/mL — ABNORMAL HIGH (ref 0.30–0.70)
Heparin Unfractionated: 0.81 [IU]/mL — ABNORMAL HIGH (ref 0.30–0.70)

## 2021-06-14 LAB — GLUCOSE, CAPILLARY
Glucose-Capillary: 128 mg/dL — ABNORMAL HIGH (ref 70–99)
Glucose-Capillary: 159 mg/dL — ABNORMAL HIGH (ref 70–99)
Glucose-Capillary: 185 mg/dL — ABNORMAL HIGH (ref 70–99)
Glucose-Capillary: 109 mg/dL — ABNORMAL HIGH (ref 70–99)
Glucose-Capillary: 150 mg/dL — ABNORMAL HIGH (ref 70–99)

## 2021-06-14 LAB — APTT
aPTT: 108 seconds — ABNORMAL HIGH (ref 24–36)
aPTT: 98 seconds — ABNORMAL HIGH (ref 24–36)

## 2021-06-14 NOTE — Progress Notes (Signed)
   VASCULAR SURGERY ASSESSMENT & PLAN:   PVD WITH GANGRENE OF THE LEFT THIRD TOE: He is scheduled for an arteriogram tomorrow.  I will stop his heparin at 8 AM.  Of note he was on Eliquis prior to admission and this is being held.  This is clearly a limb threatening situation.  Hopefully will have something to do from an endovascular standpoint.  Given his age and somewhat debilitated state he is not a good candidate for open surgery.   I have written his preop orders.  SUBJECTIVE:   No complaints this morning.  PHYSICAL EXAM:   Vitals:   06/13/21 1201 06/13/21 1709 06/14/21 0001 06/14/21 0433  BP: (!) 145/75 (!) 122/52 140/60 (!) 144/58  Pulse: 64 (!) 55 61 61  Resp: 19 18 18 18  Temp: 98.8 F (37.1 C) 98 F (36.7 C) 97.8 F (36.6 C) 98.1 F (36.7 C)  TempSrc: Oral Oral Oral Oral  SpO2: 98% 96% 97% 96%  Weight:    93.3 kg  Height:       No change in the wounds to his left foot.  LABS:   Lab Results  Component Value Date   WBC 8.7 06/14/2021   HGB 10.8 (L) 06/14/2021   HCT 32.5 (L) 06/14/2021   MCV 88.6 06/14/2021   PLT 237 06/14/2021   Lab Results  Component Value Date   CREATININE 1.08 06/13/2021   Lab Results  Component Value Date   INR 1.2 06/12/2021   CBG (last 3)  Recent Labs    06/13/21 2010 06/13/21 2356 06/14/21 0428  GLUCAP 111* 141* 128*    PROBLEM LIST:    Active Problems:   Cellulitis   Cellulitis of foot, left   Diabetic foot infection (HCC)   CURRENT MEDS:    carvedilol  6.25 mg Oral BID   hydrALAZINE  100 mg Oral TID   insulin aspart  0-9 Units Subcutaneous TID WC   losartan  100 mg Oral Daily   pantoprazole  40 mg Oral Daily   simvastatin  20 mg Oral QHS   sodium chloride flush  10-40 mL Intracatheter Q12H   sodium chloride flush  3 mL Intravenous Q12H   torsemide  10 mg Oral q morning    Taiwan Millon Office: [phone] 06/14/2021  

## 2021-06-14 NOTE — H&P (View-Only) (Signed)
   VASCULAR SURGERY ASSESSMENT & PLAN:   PVD WITH GANGRENE OF THE LEFT THIRD TOE: He is scheduled for an arteriogram tomorrow.  I will stop his heparin at 8 AM.  Of note he was on Eliquis prior to admission and this is being held.  This is clearly a limb threatening situation.  Hopefully will have something to do from an endovascular standpoint.  Given his age and somewhat debilitated state he is not a good candidate for open surgery.   I have written his preop orders.  SUBJECTIVE:   No complaints this morning.  PHYSICAL EXAM:   Vitals:   06/13/21 1201 06/13/21 1709 06/14/21 0001 06/14/21 0433  BP: (!) 145/75 (!) 122/52 140/60 (!) 144/58  Pulse: 64 (!) 55 61 61  Resp: 19 18 18 18   Temp: 98.8 F (37.1 C) 98 F (36.7 C) 97.8 F (36.6 C) 98.1 F (36.7 C)  TempSrc: Oral Oral Oral Oral  SpO2: 98% 96% 97% 96%  Weight:    93.3 kg  Height:       No change in the wounds to his left foot.  LABS:   Lab Results  Component Value Date   WBC 8.7 06/14/2021   HGB 10.8 (L) 06/14/2021   HCT 32.5 (L) 06/14/2021   MCV 88.6 06/14/2021   PLT 237 06/14/2021   Lab Results  Component Value Date   CREATININE 1.08 06/13/2021   Lab Results  Component Value Date   INR 1.2 06/12/2021   CBG (last 3)  Recent Labs    06/13/21 2010 06/13/21 2356 06/14/21 0428  GLUCAP 111* 141* 128*    PROBLEM LIST:    Active Problems:   Cellulitis   Cellulitis of foot, left   Diabetic foot infection (HCC)   CURRENT MEDS:    carvedilol  6.25 mg Oral BID   hydrALAZINE  100 mg Oral TID   insulin aspart  0-9 Units Subcutaneous TID WC   losartan  100 mg Oral Daily   pantoprazole  40 mg Oral Daily   simvastatin  20 mg Oral QHS   sodium chloride flush  10-40 mL Intracatheter Q12H   sodium chloride flush  3 mL Intravenous Q12H   torsemide  10 mg Oral q morning    08/14/21 Office: [phone] 06/14/2021

## 2021-06-14 NOTE — Progress Notes (Signed)
Occupational Therapy Treatment Patient Details Name: Cameron Ruiz MRN: [id number] DOB: March 13, 1931 Today's Date: 06/14/2021    History of present illness PREVIN JIAN is a 87 y.o. male admitted on 06/11/2021 with osteomyelitis of left foot wound. PMH includes diabetes on insulin, cardiovascular disease s/p CABG, HTN, and HLD.   OT comments  Pt progressing. Pt tolerating session well with OOB ADL tasks and functional mobility with RW with minguardA to minA. Pt ambulating a house hold distance in room with RW. Pt would benefit from continued OT skilled services. OT following acutely.   Follow Up Recommendations  Home health OT;Supervision - Intermittent    Equipment Recommendations  3 in 1 bedside commode;Tub/shower bench    Recommendations for Other Services      Precautions / Restrictions Precautions Precautions: Other (comment) Precaution Comments: poor vision Restrictions Weight Bearing Restrictions: No       Mobility Bed Mobility Overal bed mobility: Needs Assistance Bed Mobility: Supine to Sit     Supine to sit: Supervision;HOB elevated     General bed mobility comments: increased time    Transfers Overall transfer level: Needs assistance Equipment used: Rolling walker (2 wheeled) Transfers: Sit to/from Stand Sit to Stand: Min assist         General transfer comment: minA for power up and to scoot to EOB    Balance Overall balance assessment: Needs assistance Sitting-balance support: No upper extremity supported;Feet unsupported Sitting balance-Leahy Scale: Fair     Standing balance support: Single extremity supported;During functional activity Standing balance-Leahy Scale: Poor Standing balance comment: walker and min guard for static standing                           ADL either performed or assessed with clinical judgement   ADL Overall ADL's : Needs assistance/impaired     Grooming: Wash/dry hands;Oral care;Wash/dry  face;Applying deodorant;Brushing hair;Supervision/safety;Standing Grooming Details (indicate cue type and reason): standing x3 mins before requiring to keep moving due to stiffness                             Functional mobility during ADLs: Min guard;Rolling walker;Cueing for safety General ADL Comments: Pt tolerating session well with OOB ADL tasks and functional mobility with RW.     Vision   Vision Assessment?: Vision impaired- to be further tested in functional context   Perception     Praxis      Cognition Arousal/Alertness: Awake/alert Behavior During Therapy: WFL for tasks assessed/performed Overall Cognitive Status: Within Functional Limits for tasks assessed                                          Exercises     Shoulder Instructions       General Comments VSS. Pt ambulating a house hold distance in room with RW.    Pertinent Vitals/ Pain       Pain Assessment: No/denies pain  Home Living                                          Prior Functioning/Environment              Frequency  Min 2X/week  Progress Toward Goals  OT Goals(current goals can now be found in the care plan section)  Progress towards OT goals: Progressing toward goals  Acute Rehab OT Goals Patient Stated Goal: return home OT Goal Formulation: With patient Time For Goal Achievement: 06/27/21 Potential to Achieve Goals: Fair  Plan Discharge plan remains appropriate    Co-evaluation                 AM-PAC OT "6 Clicks" Daily Activity     Outcome Measure   Help from another person eating meals?: None Help from another person taking care of personal grooming?: A Little Help from another person toileting, which includes using toliet, bedpan, or urinal?: A Little Help from another person bathing (including washing, rinsing, drying)?: A Little Help from another person to put on and taking off regular upper body  clothing?: None Help from another person to put on and taking off regular lower body clothing?: A Little 6 Click Score: 20    End of Session Equipment Utilized During Treatment: Gait belt;Rolling walker  OT Visit Diagnosis: Unsteadiness on feet (R26.81);Muscle weakness (generalized) (M62.81);Pain   Activity Tolerance Patient tolerated treatment well   Patient Left in chair;with call bell/phone within reach   Nurse Communication Mobility status;Precautions        Time: 2297-9892 OT Time Calculation (min): 28 min  Charges: OT General Charges $OT Visit: 1 Visit OT Treatments $Self Care/Home Management : 8-22 mins $Therapeutic Activity: 8-22 mins  Flora Lipps, OTR/L Acute Rehabilitation Services Pager: [phone] Office: [phone]    Brandy Zuba C 06/14/2021, 8:47 PM

## 2021-06-14 NOTE — Progress Notes (Signed)
ANTICOAGULATION CONSULT NOTE   Pharmacy Consult for heparin dosing Indication: atrial fibrillation  Allergies  Allergen Reactions   Carbocaine [Mepivacaine]    Cogentin [Benztropine]    Fluogen [Influenza Virus Vaccine]     Flu & pneumonia vaccine   Keflex [Cephalexin]    Kenalog [Triamcinolone]    Levaquin [Levofloxacin]     Patient Measurements: Height: 6\' 1"  (185.4 cm) (stated) Weight: 93.3 kg (205 lb 11 oz) IBW/kg (Calculated) : 79.9 Heparin Dosing Weight: 99.8 kg  Vital Signs: Temp: 98.5 F (36.9 C) (08/01 1228) Temp Source: Oral (08/01 1228) BP: 132/55 (08/01 1228) Pulse Rate: 57 (08/01 1228)  Labs: Recent Labs    06/11/21 1732 06/12/21 1022 06/12/21 2016 06/13/21 0157 06/13/21 1851 06/14/21 0439 06/14/21 1014  HGB 13.6 10.9*  --  11.1*  --  10.8*  --   HCT 42.4 34.1*  --  33.7*  --  32.5*  --   PLT 202 239  --  239  --  237  --   APTT 44*  --   --   --  52*  --  108*  LABPROT 16.2*  --  15.3*  --   --   --   --   INR 1.3*  --  1.2  --   --   --   --   HEPARINUNFRC  --   --   --   --  0.53 >1.10* 0.81*  CREATININE 1.42* 1.22  --  1.08  --   --   --      Estimated Creatinine Clearance: 53.4 mL/min (by C-G formula based on SCr of 1.08 mg/dL).   Medical History: History reviewed. No pertinent past medical history.  Medications:  Scheduled:   carvedilol  6.25 mg Oral BID   hydrALAZINE  100 mg Oral TID   insulin aspart  0-9 Units Subcutaneous TID WC   losartan  100 mg Oral Daily   pantoprazole  40 mg Oral Daily   simvastatin  20 mg Oral QHS   sodium chloride flush  10-40 mL Intracatheter Q12H   sodium chloride flush  3 mL Intravenous Q12H   torsemide  10 mg Oral q morning   Infusions:   ceFEPime (MAXIPIME) IV 2 g (06/14/21 0510)   heparin 1,700 Units/hr (06/14/21 0325)   metronidazole 500 mg (06/14/21 1045)   vancomycin 1,000 mg (06/14/21 0944)    Assessment: 87 year old male presenting for diabetic foot infection with osteomyelitis.  History of atrial fibrillation on PTA Eliquis. Has not received any Eliquis in hospital and last dose unknown. SCr 1.08 with CrCl of 58.8 ml/min. Hgb 11.1, HCT 33.7, PLT 239.   Aptt this evening slightly above goal at 108 secs.  No known issues with IV infusion, no bleeding or complications noted.  Heparin level appears a little falsely elevated from recent Eliquis.  Goal of Therapy:  Heparin level 0.3-0.7 units/ml aPTT 66-102 seconds Monitor platelets by anticoagulation protocol: Yes   Plan:  Decrease IV heparin to 1650 units/hr. Repeat heparin level, aPTT and CBC with AM labs.  98, PharmD, [facility] Clinical Pharmacist Please see AMION for all Pharmacists' Contact Phone Numbers 06/14/2021, 12:48 PM

## 2021-06-14 NOTE — Progress Notes (Signed)
Subjective:  Cameron Ruiz was evaluated and examined at the bedside this morning. He reports that his left heel and foot is sore but he is not in any significant pain overall. He notes that two night ago he became confused and tangled up in the cords and tubes connected to him, but he states he has since felt oriented and had no trouble with confusion or disorientation last night. He expresses his concern over the potential loss of his foot and states that he strongly desires to keep his foot. He states his wife in [place], [place] is also concerned about him losing his foot. His granddaughter who was present with him on admission has started a new job, and he is looking forward to when she will be able to make her way back to [facility] to see him in the hospital.  Objective:  Vital signs in last 24 hours: Vitals:   06/13/21 1709 06/14/21 0001 06/14/21 0433 06/14/21 1228  BP: (!) 122/52 140/60 (!) 144/58 (!) 132/55  Pulse: (!) 55 61 61 (!) 57  Resp: 18 18 18 16   Temp: 98 F (36.7 C) 97.8 F (36.6 C) 98.1 F (36.7 C) 98.5 F (36.9 C)  TempSrc: Oral Oral Oral Oral  SpO2: 96% 97% 96% 100%  Weight:   93.3 kg   Height:       Weight change: -6.491 kg  Intake/Output Summary (Last 24 hours) at 06/14/2021 1327 Last data filed at 06/14/2021 0620 Gross per 24 hour  Intake 1084.36 ml  Output 1000 ml  Net 84.36 ml   Physical Exam: Constitutional: well-appearing and sitting in the chair comfortably, in no acute distress Eyes: subconjunctival pallor Pulmonary/Chest: normal work of breathing on room air Skin:  Left lower extremity left foot is swollen, erythematous, with wounds and dry gangrene on the first, second, and third toes along with a dorsal ulcer, left hallux is blue/red in color and cold to the touch, subsequent toes (2-5) improve in color and temperature, overall white/yellow ashy/scaly appearance, diminished dorsalis pedis pulses Right lower extremity  toenail deformities consistent  with vascular disease changes or onychomycosis.   Neurological: alert & oriented x 3, appropriate insight into his care  Pertinent Labs: CBC    Component Value Date/Time   WBC 8.7 06/14/2021 0439   RBC 3.67 (L) 06/14/2021 0439   HGB 10.8 (L) 06/14/2021 0439   HCT 32.5 (L) 06/14/2021 0439   PLT 237 06/14/2021 0439   MCV 88.6 06/14/2021 0439   MCH 29.4 06/14/2021 0439   MCHC 33.2 06/14/2021 0439   RDW 12.4 06/14/2021 0439   LYMPHSABS 1.3 06/11/2021 1732   MONOABS 0.5 06/11/2021 1732   EOSABS 0.2 06/11/2021 1732   BASOSABS 0.0 06/11/2021 1732   CBG (last 3)  Recent Labs    06/14/21 0428 06/14/21 0838 06/14/21 1107  GLUCAP 128* 159* 185*   Assessment/Plan:  Principal Problem:   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 Fib   CAD s/p CABG   CHF   HTN   HLD   DM  Patient Summary: Cameron Ruiz is a 87 year-old man with DM, CAD s/p CABG, HTN, HLD, and blindness who presented with wounds on the left foot with mild pain and was admitted for management of a left diabetic foot wound with cellulitis/third digit osteomyelitis complicated by vascular disease and failure of outpatient antibiotic and antifungal treatment.  Diabetic foot wound  with third digit osteomyelitis and cellulitis complicated by vascular disease Patient presented with left foot cellulitis and dry gangrenous changes on left toes, found to have osteomyelitis of left foot third digit seen on MRI.  Foot infection is complicated by diabetes and vascular disease.  He has failed outpatient antibiotic and antifungal treatment.  Patient is on antibiotic coverage with IV vancomycin, cefepime for pseudomonal coverage, and metronidazole for anaerobic coverage. ABIs 75% on the right and 34% on the left with left toe pressure of 0. Patient's pain is currently well controlled. Blood cultures negative at 24  and 48 hours. Vascular surgery is following and will appreciate their recommendations. -Continue IV vancomycin, cefepime, and Flagyl -Acetaminophen 650 mg every 6 hours as needed for pain -Per Vascular Surgery:  -Arteriogram tomorrow  -Stop heparin at 08:00 on 06/15/21 -Will attempt to contact patient's granddaughter to provide updates  Normochromic Normocytic Anemia Patient with a Hgb of 10.8 down from 13.6 three days ago. MCH 29.4 and MCV 88.6. RDW 12.4. Patient is asymptomatic. Pt is awaiting arteriogram and will follow up after procedure with potential workup. -reticulocyte count -ferritin -trend CBC and transfuse if Hgb < 7.0  Paroxysmal A. Fib On Eliquis at home. Placed on heparin gtt for possible surgery. -continue heparin gtt with hold at 08:00 on 06/15/21  CAD s/p CABG CHF Patient had open heart surgery >20 years ago. -Continue home ASA 81 mg, nitroglycerin 0.4 mg prn, simvastatin 20 mg daily, and torsemide 10 mg daily  HTN HLD -Continue home hydralazine 100 mg TID, losartan 100 mg daily, Coreg 6.25 mg BID -Continue home simvastatin 20 mg daily  DM Patient on NovoLog 70/30 at home, takes 20 units in the morning and 20 units at night. Blood glucose levels have been within tolerable limits since admission. -Continue SSI   LOS: 2 days   Val Eagle, Medical Student 06/14/2021, 1:27 PM

## 2021-06-15 ENCOUNTER — Encounter (HOSPITAL_COMMUNITY): Payer: Self-pay | Admitting: Surgery

## 2021-06-15 ENCOUNTER — Inpatient Hospital Stay (HOSPITAL_COMMUNITY)
Admission: EM | Disposition: A | Discharge status: Skilled Nursing Facility | Payer: Self-pay | Source: Home / Self Care | Attending: Internal Medicine

## 2021-06-15 DIAGNOSIS — I70262 Atherosclerosis of native arteries of extremities with gangrene, left leg: Secondary | ICD-10-CM

## 2021-06-15 HISTORY — PX: ABDOMINAL AORTOGRAM W/LOWER EXTREMITY: CATH118223

## 2021-06-15 LAB — GLUCOSE, CAPILLARY
Glucose-Capillary: 122 mg/dL — ABNORMAL HIGH (ref 70–99)
Glucose-Capillary: 127 mg/dL — ABNORMAL HIGH (ref 70–99)
Glucose-Capillary: 129 mg/dL — ABNORMAL HIGH (ref 70–99)
Glucose-Capillary: 129 mg/dL — ABNORMAL HIGH (ref 70–99)
Glucose-Capillary: 131 mg/dL — ABNORMAL HIGH (ref 70–99)
Glucose-Capillary: 154 mg/dL — ABNORMAL HIGH (ref 70–99)

## 2021-06-15 LAB — RETICULOCYTES
Immature Retic Fract: 7.5 % (ref 2.3–15.9)
RBC.: 3.63 MIL/uL — ABNORMAL LOW (ref 4.22–5.81)
Retic Count, Absolute: 43.6 10*3/uL (ref 19.0–186.0)
Retic Ct Pct: 1.2 % (ref 0.4–3.1)

## 2021-06-15 LAB — CBC
HCT: 31.7 % — ABNORMAL LOW (ref 39.0–52.0)
Hemoglobin: 10.7 g/dL — ABNORMAL LOW (ref 13.0–17.0)
MCH: 29.5 pg (ref 26.0–34.0)
MCHC: 33.8 g/dL (ref 30.0–36.0)
MCV: 87.3 fL (ref 80.0–100.0)
Platelets: 219 10*3/uL (ref 150–400)
RBC: 3.63 MIL/uL — ABNORMAL LOW (ref 4.22–5.81)
RDW: 12.3 % (ref 11.5–15.5)
WBC: 5.9 10*3/uL (ref 4.0–10.5)
nRBC: 0 % (ref 0.0–0.2)

## 2021-06-15 LAB — FERRITIN: Ferritin: 25 ng/mL (ref 24–336)

## 2021-06-15 LAB — POCT ACTIVATED CLOTTING TIME: Activated Clotting Time: 219 seconds

## 2021-06-15 LAB — APTT
aPTT: 138 seconds — ABNORMAL HIGH (ref 24–36)
aPTT: 121 seconds — ABNORMAL HIGH (ref 24–36)

## 2021-06-15 LAB — HEPARIN LEVEL (UNFRACTIONATED): Heparin Unfractionated: 0.83 IU/mL — ABNORMAL HIGH (ref 0.30–0.70)

## 2021-06-15 SURGERY — ABDOMINAL AORTOGRAM W/LOWER EXTREMITY
Anesthesia: LOCAL | Laterality: Left

## 2021-06-15 MED ORDER — HEPARIN (PORCINE) 25000 UT/250ML-% IV SOLN: 1600.0000 [IU]/h | INTRAVENOUS | Status: DC

## 2021-06-15 MED ORDER — LIDOCAINE HCL (PF) 1 % IJ SOLN
INTRAMUSCULAR | Status: AC
  Filled 2021-06-15: qty 30

## 2021-06-15 MED ORDER — MIDAZOLAM HCL 2 MG/2ML IJ SOLN
INTRAMUSCULAR | Status: DC | PRN
  Administered 2021-06-15 (×2): 1 mg via INTRAVENOUS

## 2021-06-15 MED ORDER — HEPARIN SODIUM (PORCINE) 1000 UNIT/ML IJ SOLN
INTRAMUSCULAR | Status: DC | PRN
  Administered 2021-06-15: 9000 [IU] via INTRAVENOUS

## 2021-06-15 MED ORDER — FENTANYL CITRATE (PF) 100 MCG/2ML IJ SOLN
INTRAMUSCULAR | Status: AC
  Filled 2021-06-15: qty 2

## 2021-06-15 MED ORDER — HEPARIN (PORCINE) IN NACL 1000-0.9 UT/500ML-% IV SOLN
INTRAVENOUS | Status: AC
  Filled 2021-06-15: qty 500

## 2021-06-15 MED ORDER — HYDRALAZINE HCL 20 MG/ML IJ SOLN: 5.0000 mg | INTRAMUSCULAR | Status: DC | PRN

## 2021-06-15 MED ORDER — LIP MEDEX EX OINT
1.0000 | TOPICAL_OINTMENT | CUTANEOUS | Status: DC | PRN
Start: 2021-06-15 — End: 2021-06-24
  Administered 2021-06-15 – 2021-06-17 (×2): 1 via TOPICAL
  Filled 2021-06-15 (×2): qty 7

## 2021-06-15 MED ORDER — ONDANSETRON HCL 4 MG/2ML IJ SOLN
4.0000 mg | Freq: Four times a day (QID) | INTRAMUSCULAR | Status: DC | PRN
  Administered 2021-06-17: 4 mg via INTRAVENOUS
  Filled 2021-06-15: qty 2

## 2021-06-15 MED ORDER — FENTANYL CITRATE (PF) 100 MCG/2ML IJ SOLN
INTRAMUSCULAR | Status: DC | PRN
  Administered 2021-06-15: 25 ug via INTRAVENOUS
  Administered 2021-06-15 (×2): 50 ug via INTRAVENOUS

## 2021-06-15 MED ORDER — IODIXANOL 320 MG/ML IV SOLN
INTRAVENOUS | Status: DC | PRN
  Administered 2021-06-15: 80 mL

## 2021-06-15 MED ORDER — SODIUM CHLORIDE 0.9 % WEIGHT BASED INFUSION
1.0000 mL/kg/h | INTRAVENOUS | Status: AC
  Administered 2021-06-15: 1 mL/kg/h via INTRAVENOUS

## 2021-06-15 MED ORDER — MIDAZOLAM HCL 2 MG/2ML IJ SOLN
INTRAMUSCULAR | Status: AC
  Filled 2021-06-15: qty 2

## 2021-06-15 MED ORDER — SODIUM CHLORIDE 0.9 % IV SOLN: 250.0000 mL | INTRAVENOUS | Status: DC | PRN

## 2021-06-15 MED ORDER — LIDOCAINE HCL (PF) 1 % IJ SOLN
INTRAMUSCULAR | Status: DC | PRN
  Administered 2021-06-15: 15 mL

## 2021-06-15 MED ORDER — LABETALOL HCL 5 MG/ML IV SOLN
10.0000 mg | INTRAVENOUS | Status: DC | PRN
Start: 2021-06-15 — End: 2021-06-18

## 2021-06-15 MED ORDER — ACETAMINOPHEN 325 MG PO TABS
650.0000 mg | ORAL_TABLET | ORAL | Status: DC | PRN
  Administered 2021-06-15: 650 mg via ORAL
  Filled 2021-06-15 (×2): qty 2

## 2021-06-15 MED ORDER — HEPARIN SODIUM (PORCINE) 1000 UNIT/ML IJ SOLN
INTRAMUSCULAR | Status: AC
  Filled 2021-06-15: qty 1

## 2021-06-15 MED ORDER — SODIUM CHLORIDE 0.9% FLUSH
3.0000 mL | Freq: Two times a day (BID) | INTRAVENOUS | Status: DC
  Administered 2021-06-16 – 2021-06-24 (×11): 3 mL via INTRAVENOUS

## 2021-06-15 MED ORDER — SODIUM CHLORIDE 0.9% FLUSH: 3.0000 mL | INTRAVENOUS | Status: DC | PRN

## 2021-06-15 MED ORDER — SODIUM CHLORIDE 0.9 % IV SOLN: INTRAVENOUS | Status: DC

## 2021-06-15 MED ORDER — HEPARIN (PORCINE) 25000 UT/250ML-% IV SOLN
1600.0000 [IU]/h | INTRAVENOUS | Status: DC
  Administered 2021-06-15 – 2021-06-17 (×3): 1600 [IU]/h via INTRAVENOUS
  Filled 2021-06-15 (×3): qty 250

## 2021-06-15 SURGICAL SUPPLY — 14 items
CATH OMNI FLUSH 5F 65CM (CATHETERS) ×1 IMPLANT
CATH QUICKCROSS .035X135CM (MICROCATHETER) ×1 IMPLANT
GLIDEWIRE ADV .035X260CM (WIRE) ×1 IMPLANT
KIT MICROPUNCTURE NIT STIFF (SHEATH) ×1 IMPLANT
KIT PV (KITS) ×2 IMPLANT
SHEATH PINNACLE 5F 10CM (SHEATH) ×1 IMPLANT
SHEATH PINNACLE 6F 10CM (SHEATH) ×1 IMPLANT
SHEATH PINNACLE ST 6F 65CM (SHEATH) ×1 IMPLANT
SHEATH PROBE COVER 6X72 (BAG) ×1 IMPLANT
STOPCOCK MORSE 400PSI 3WAY (MISCELLANEOUS) ×1 IMPLANT
SYR MEDRAD MARK V 150ML (SYRINGE) ×1 IMPLANT
TRANSDUCER W/STOPCOCK (MISCELLANEOUS) ×2 IMPLANT
TRAY PV CATH (CUSTOM PROCEDURE TRAY) ×2 IMPLANT
WIRE BENTSON .035X145CM (WIRE) ×1 IMPLANT

## 2021-06-15 NOTE — Progress Notes (Signed)
ANTICOAGULATION CONSULT NOTE  Pharmacy Consult for heparin Indication: atrial fibrillation  Allergies  Allergen Reactions   Carbocaine [Mepivacaine]    Cogentin [Benztropine]    Fluogen [Influenza Virus Vaccine]     Flu & pneumonia vaccine   Keflex [Cephalexin]    Kenalog [Triamcinolone]    Levaquin [Levofloxacin]     Patient Measurements: Height: 6\' 1"  (185.4 cm) (stated) Weight: 93.3 kg (205 lb 11 oz) IBW/kg (Calculated) : 79.9 Heparin Dosing Weight: 99.8 kg  Vital Signs: Temp: 98 F (36.7 C) (08/02 1432) Temp Source: Oral (08/02 1432) BP: 130/68 (08/02 1432) Pulse Rate: 66 (08/02 1229)  Labs: Recent Labs     0000 06/12/21 2016 06/13/21 0157 06/13/21 1851 06/14/21 0439 06/14/21 1014 06/14/21 2224 06/15/21 0303 06/15/21 1440  HGB   < >  --  11.1*  --  10.8*  --   --  10.7*  --   HCT  --   --  33.7*  --  32.5*  --   --  31.7*  --   PLT  --   --  239  --  237  --   --  219  --   APTT  --   --   --    < >  --  108* 98* 138* 121*  LABPROT  --  15.3*  --   --   --   --   --   --   --   INR  --  1.2  --   --   --   --   --   --   --   HEPARINUNFRC  --   --   --    < > >1.10* 0.81*  --  0.83*  --   CREATININE  --   --  1.08  --   --   --   --   --   --    < > = values in this interval not displayed.     Estimated Creatinine Clearance: 53.4 mL/min (by C-G formula based on SCr of 1.08 mg/dL).  Assessment: 87 y.o. male with h/o Afib, Eliquis on hold, for heparin S/p vascular procedure today Will resume heparin at 8 pm  Goal of Therapy:  Heparin level 0.3-0.7 units/ml aPTT 66-102 seconds Monitor platelets by anticoagulation protocol: Yes   Plan:  Heparin at 1600 units / hr starting at 8 pm Daily heparin level, PTT, CBC  Thank you 98, PharmD 06/15/2021, 4:01 PM

## 2021-06-15 NOTE — Progress Notes (Signed)
Subjective:  Cameron Ruiz was evaluated and examined at the bedside today. He reports that his left heel is more sore than yesterday and he has been trying to keep it off the bed. He states he experienced some more confusion last night, but he is feeling better and more oriented this morning.  Cameron Ruiz was accompanied by his son and granddaughter and the three were interested to hear the results of his arteriogram today. The results of the study were discussed and the potential for amputation was addressed. He voiced a strong hope to keep the limb if at all possible.  Objective:  Vital signs in last 24 hours: Vitals:   06/15/21 1204 06/15/21 1209 06/15/21 1214 06/15/21 1229  BP: 119/60 (!) 110/59 114/61 (!) 136/46  Pulse: 65 64 65 66  Resp: 11 (!) 0 (!) 22 20  Temp:      TempSrc:      SpO2: 91% 92% 92%   Weight:      Height:       Weight change: 0 kg  Intake/Output Summary (Last 24 hours) at 06/15/2021 1418 Last data filed at 06/15/2021 4709 Gross per 24 hour  Intake 0 ml  Output 1700 ml  Net -1700 ml    Physical Exam: Constitutional: well-appearing and laying in bed, left leg is crossed over right as to avoid having left heel on bed, in no acute distress Pulmonary/Chest: normal work of breathing on room air Skin:  Left lower extremity Absence of hair on the left lower extremity at the level of the knee and below, left foot continues to be swollen, erythematous, with wounds and dry gangrene on the first, second, and third toes along with a dorsal ulcer, left hallux is blue/red in color with increased darkness from yesterday, subsequent toes (2-5) improve in color but are also darker today, overall white/yellow ashy/scaly appearance, heel is boggy when palpated Neurological: alert and answering questions appropriately, appropriate insight into his care  Pertinent Labs: CBC    Component Value Date/Time   WBC 5.9 06/15/2021 0303   RBC 3.63 (L) 06/15/2021 0303   RBC 3.63  (L) 06/15/2021 0303   HGB 10.7 (L) 06/15/2021 0303   HCT 31.7 (L) 06/15/2021 0303   PLT 219 06/15/2021 0303   MCV 87.3 06/15/2021 0303   MCH 29.5 06/15/2021 0303   MCHC 33.8 06/15/2021 0303   RDW 12.3 06/15/2021 0303   LYMPHSABS 1.3 06/11/2021 1732   MONOABS 0.5 06/11/2021 1732   EOSABS 0.2 06/11/2021 1732   BASOSABS 0.0 06/11/2021 1732   CBG (last 3)  Recent Labs    06/15/21 0012 06/15/21 0429 06/15/21 1229  GLUCAP 127* 122* 154*   Iron/TIBC/Ferritin/ %Sat    Component Value Date/Time   FERRITIN 25 06/15/2021 0303   Assessment/Plan:  Principal Problem:   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 Fib   CAD s/p CABG   CHF   HTN   HLD   DM  Patient Summary: Cameron Ruiz is a 87 year-old man with DM, CAD s/p CABG, HTN, HLD, and blindness who presented with wounds on the left foot with mild pain and was admitted for management of a left diabetic foot wound with cellulitis/third digit osteomyelitis complicated by vascular disease and failure of outpatient antibiotic and antifungal treatment.  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  Foot infection is complicated by diabetes and vascular disease. Blood cultures are negative at 24/48 hrs and patient is on antibiotic coverage with IV vancomycin, cefepime for pseudomonal coverage, and metronidazole for anaerobic coverage. Patient reports increased pain in his heel from yesterday. Arteriogram reveals multiple occlusions in the left lower extremity and patient does not appear to have good options for endovascular surgery to revascularize the limb. Vascular surgery is following and will await their plans and  appreciate their recommendations.  -Continue IV vancomycin, cefepime, and Flagyl -Acetaminophen 650 mg every 6 hours as needed for pain -Prevalon for heel protection and pain relief -Will resume heparin per vascular surgery recommendations  Normochromic Normocytic Anemia Patient with a Hgb of 10.7. Reticulocyte count 1.2%. Ferritin 25. Patient is hypoproliferative and iron deficient. IV iron not indicated at this time as patient has active infection. Patient will likely benefit from iron supplementation as an outpatient. -Trend CBC -Transfuse if Hgb < 7.0  Paroxysmal A. Fib On Eliquis at home. Placed on heparin gtt in preparation for procedures/surgeries. -resume heparin gtt per vascular surgery  CAD s/p CABG CHF Patient had open heart surgery >20 years ago. -Continue home ASA 81 mg, nitroglycerin 0.4 mg prn, simvastatin 20 mg daily, and torsemide 10 mg daily  HTN HLD -Continue home hydralazine 100 mg TID, losartan 100 mg daily, Coreg 6.25 mg BID -Continue home simvastatin 20 mg daily  DM Patient on NovoLog 70/30 at home, takes 20 units in the morning and 20 units at night. Blood glucose levels have been within tolerable limits since admission. -Continue SSI   LOS: 3 days   Val Eagle, Medical Student 06/15/2021, 2:18 PM

## 2021-06-15 NOTE — Progress Notes (Signed)
PT Cancellation Note  Patient Details Name: Cameron Ruiz MRN: [id number] DOB: 01-30-1931   Cancelled Treatment:    Reason Eval/Treat Not Completed: Patient at procedure or test/unavailable. Will re-attempt later as patient available.   Alezandra Egli 06/15/2021, 10:34 AM

## 2021-06-15 NOTE — Op Note (Signed)
Patient name: Cameron Ruiz MRN: [id number] DOB: 07-10-1931 Sex: male  06/15/2021 Pre-operative Diagnosis: Left foot ulcer Post-operative diagnosis:  Same Surgeon:  Durene Cal Procedure Performed:  1.  Ultrasound-guided access, right femoral artery  2.  Abdominal aortogram  3.  Left lower extremity runoff  4.  Failed angioplasty left popliteal artery  5.  Closure device, Celt  6.  Conscious sedation, 62 minutes    Indications: This is an 87 year old gentleman with nonhealing wounds on his left foot he comes in today for further revascularization.  Procedure:  The patient was identified in the holding area and taken to room 8.  The patient was then placed supine on the table and prepped and draped in the usual sterile fashion.  A time out was called.  Conscious sedation was administered with the use of IV fentanyl and Versed under continuous physician and nurse monitoring.  Heart rate, blood pressure, and oxygen saturation were continuously monitored.  Total sedation time was 64 minutes.  Ultrasound was used to evaluate the right common femoral artery.  It was patent .  A digital ultrasound image was acquired.  A micropuncture needle was used to access the right common femoral artery under ultrasound guidance.  An 018 wire was advanced without resistance and a micropuncture sheath was placed.  The 018 wire was removed and a benson wire was placed.  The micropuncture sheath was exchanged for a 5 french sheath.  An omniflush catheter was advanced over the wire to the level of L-1.  An abdominal angiogram was obtained.  Next, using the omniflush catheter and a benson wire, the aortic bifurcation was crossed and the catheter was placed into theleft external iliac artery and left runoff was obtained.    Findings:   Aortogram: No significant renal artery stenosis.  The infrarenal abdominal aorta is widely patent.  Bilateral common and external iliac arteries are patent without significant  stenosis.  Right Lower Extremity: Not evaluated  Left Lower Extremity: The left common femoral profundofemoral artery are patent, calcified, without stenosis.  The superficial femoral artery is patent proximally however it occludes just beyond the adductor canal.  The vessel is heavily calcified.  The popliteal artery is occluded throughout its length.  There is reconstitution of a posterior tibial and peroneal artery which are heavily calcified.  The posterior tibial becomes very diminutive at the ankle.  There is delayed reconstitution of the dorsalis pedis on the ankle.  Severe foot disease is noted.  Intervention: After the above images were acquired the decision was made to proceed with intervention.  A 6 French 65 cm sheath was advanced into the superficial femoral artery.  I used a 035 Glidewire advantage and a quick cross catheter and tried to cross the occlusion.  I was able to get the catheter down to the proximal patella and then because of the calcification I could not advance the wire or catheter.  After attempting this for a while I did not feel that this would be possible and so I attempted pedal access.  The patient's posterior tibial was diminutive at the ankle.  It also appeared to be heavily calcified.  I did not think it was going to be a good target however just prior to cannulation the patient became very agitated and and contaminated the foot.  His contracture prevented good visualization of the posterior tibial.  I elected to stop at this time.  The groin was closed with a Celt  Impression:  #1  No significant aortoiliac occlusive disease  #2  The left superficial femoral and popliteal artery heavily calcified and occluded with reconstitution of diseased posterior tibial and peroneal artery.  Failed attempt at crossing the lesion.  Because of the patient's contracture I had difficulty with pedal access.  I do not think the patient has good options for revascularization from  endovascular approach.    Juleen China, M.D., [facility] [place] Office: [phone] Pager:  [phone]

## 2021-06-15 NOTE — Progress Notes (Signed)
Pt arrived to 4E from cath lab. Tele applied. CHG bath given. VSS. Pt oriented to room. Son and daughter in law at bedside.  Brooke Pace, RN

## 2021-06-15 NOTE — Interval H&P Note (Signed)
History and Physical Interval Note:  06/15/2021 10:25 AM  Cameron Ruiz  has presented today for surgery, with the diagnosis of pad.  The various methods of treatment have been discussed with the patient and family. After consideration of risks, benefits and other options for treatment, the patient has consented to  Procedure(s): ABDOMINAL AORTOGRAM W/LOWER EXTREMITY (N/A) as a surgical intervention.  The patient's history has been reviewed, patient examined, no change in status, stable for surgery.  I have reviewed the patient's chart and labs.  Questions were answered to the patient's satisfaction.     Durene Cal

## 2021-06-15 NOTE — Progress Notes (Signed)
VASCULAR SURGERY:  I have reviewed his arteriogram from today.  He has diffuse infrainguinal arterial occlusive disease with severe calcific disease.  He was not a candidate for an endovascular approach.  I do not think he is a good candidate for bypass surgery either.  He has diffuse calcific disease, he is 87 years old, and given the extent of the wounds on his left foot, including a full-thickness wound on the dorsum of the foot, even if he underwent successful bypass and not sure the foot could be salvaged.  All things considered I would recommend primary below the knee amputation versus above-the-knee amputation.  I have discussed this with him tonight.  He obviously would like to think about this before making a decision.  I could potentially consider proceeding on Friday if the patient were agreeable.  Currently I do not think the wounds on his left foot are making him septic so if he wants to hold off for now that is not unreasonable.  Cari Caraway, MD 7:23 PM

## 2021-06-15 NOTE — Care Management Important Message (Signed)
Important Message  Patient Details  Name: Cameron Ruiz MRN: [id number] Date of Birth: Apr 09, 1931   Medicare Important Message Given:  Yes  Patient left prior to IM delivery mailing IM document to the patient home home address.    [name] 06/15/2021, 12:26 PM

## 2021-06-15 NOTE — Progress Notes (Signed)
ANTICOAGULATION CONSULT NOTE  Pharmacy Consult for heparin Indication: atrial fibrillation  Allergies  Allergen Reactions   Carbocaine [Mepivacaine]    Cogentin [Benztropine]    Fluogen [Influenza Virus Vaccine]     Flu & pneumonia vaccine   Keflex [Cephalexin]    Kenalog [Triamcinolone]    Levaquin [Levofloxacin]     Patient Measurements: Height: 6\' 1"  (185.4 cm) (stated) Weight: 93.3 kg (205 lb 11 oz) IBW/kg (Calculated) : 79.9 Heparin Dosing Weight: 99.8 kg  Vital Signs: Temp: 97.7 F (36.5 C) (08/02 0016) Temp Source: Oral (08/02 0016) BP: 112/50 (08/02 0016) Pulse Rate: 73 (08/02 0016)  Labs: Recent Labs    06/12/21 1022 06/12/21 2016 06/13/21 0157 06/13/21 1851 06/14/21 0439 06/14/21 1014 06/14/21 2224  HGB 10.9*  --  11.1*  --  10.8*  --   --   HCT 34.1*  --  33.7*  --  32.5*  --   --   PLT 239  --  239  --  237  --   --   APTT  --   --   --  52*  --  108* 98*  LABPROT  --  15.3*  --   --   --   --   --   INR  --  1.2  --   --   --   --   --   HEPARINUNFRC  --   --   --  0.53 >1.10* 0.81*  --   CREATININE 1.22  --  1.08  --   --   --   --      Estimated Creatinine Clearance: 53.4 mL/min (by C-G formula based on SCr of 1.08 mg/dL).  Assessment: 87 y.o. male with h/o Afib, Eliquis on hold, for heparin  Goal of Therapy:  Heparin level 0.3-0.7 units/ml aPTT 66-102 seconds Monitor platelets by anticoagulation protocol: Yes   Plan:  Continue Heparin at current rate  98, PharmD, BCPS  06/15/2021, 12:22 AM

## 2021-06-16 LAB — CULTURE, BLOOD (ROUTINE X 2)
Culture: NO GROWTH
Culture: NO GROWTH

## 2021-06-16 LAB — CBC
HCT: 29.8 % — ABNORMAL LOW (ref 39.0–52.0)
Hemoglobin: 9.9 g/dL — ABNORMAL LOW (ref 13.0–17.0)
MCH: 28.9 pg (ref 26.0–34.0)
MCHC: 33.2 g/dL (ref 30.0–36.0)
MCV: 87.1 fL (ref 80.0–100.0)
Platelets: 199 10*3/uL (ref 150–400)
RBC: 3.42 MIL/uL — ABNORMAL LOW (ref 4.22–5.81)
RDW: 12.7 % (ref 11.5–15.5)
WBC: 4.8 10*3/uL (ref 4.0–10.5)
nRBC: 0 % (ref 0.0–0.2)

## 2021-06-16 LAB — BASIC METABOLIC PANEL WITH GFR
Anion gap: 10 (ref 5–15)
BUN: 27 mg/dL — ABNORMAL HIGH (ref 8–23)
CO2: 20 mmol/L — ABNORMAL LOW (ref 22–32)
Calcium: 8.4 mg/dL — ABNORMAL LOW (ref 8.9–10.3)
Chloride: 106 mmol/L (ref 98–111)
Creatinine, Ser: 1.32 mg/dL — ABNORMAL HIGH (ref 0.61–1.24)
GFR, Estimated: 52 mL/min — ABNORMAL LOW
Glucose, Bld: 130 mg/dL — ABNORMAL HIGH (ref 70–99)
Potassium: 3.6 mmol/L (ref 3.5–5.1)
Sodium: 136 mmol/L (ref 135–145)

## 2021-06-16 LAB — APTT: aPTT: 99 seconds — ABNORMAL HIGH (ref 24–36)

## 2021-06-16 LAB — GLUCOSE, CAPILLARY
Glucose-Capillary: 131 mg/dL — ABNORMAL HIGH (ref 70–99)
Glucose-Capillary: 122 mg/dL — ABNORMAL HIGH (ref 70–99)
Glucose-Capillary: 163 mg/dL — ABNORMAL HIGH (ref 70–99)
Glucose-Capillary: 91 mg/dL (ref 70–99)
Glucose-Capillary: 103 mg/dL — ABNORMAL HIGH (ref 70–99)
Glucose-Capillary: 120 mg/dL — ABNORMAL HIGH (ref 70–99)

## 2021-06-16 LAB — HEPARIN LEVEL (UNFRACTIONATED): Heparin Unfractionated: 0.48 [IU]/mL (ref 0.30–0.70)

## 2021-06-16 MED ORDER — OXYCODONE-ACETAMINOPHEN 5-325 MG PO TABS
1.0000 | ORAL_TABLET | Freq: Four times a day (QID) | ORAL | Status: DC | PRN
  Administered 2021-06-16 – 2021-06-17 (×2): 1 via ORAL
  Filled 2021-06-16 (×2): qty 1

## 2021-06-16 MED ORDER — ACETAMINOPHEN 500 MG PO TABS
1000.0000 mg | ORAL_TABLET | Freq: Three times a day (TID) | ORAL | Status: DC
  Administered 2021-06-16 – 2021-06-18 (×7): 1000 mg via ORAL
  Filled 2021-06-16 (×8): qty 2

## 2021-06-16 MED ORDER — SODIUM CHLORIDE 0.9 % IV SOLN
2.0000 g | Freq: Two times a day (BID) | INTRAVENOUS | Status: DC
  Administered 2021-06-16 – 2021-06-19 (×6): 2 g via INTRAVENOUS
  Filled 2021-06-16 (×7): qty 2

## 2021-06-16 NOTE — Progress Notes (Signed)
PT Cancellation Note  Patient Details Name: Cameron Ruiz MRN: [id number] DOB: 1931-10-09   Cancelled Treatment:    Reason Eval/Treat Not Completed: Pain limiting ability to participate. Pt in poor spirits on arrival, sad/upset over MD recommendation for LLE amputation. Pt declining participation in therapy, stating severe pain L foot. "It feels like someone is stabbing it with a knife." Pt agreeable for PT to re-attempt tomorrow.    Ilda Foil 06/16/2021, 12:24 PM  Aida Raider, PT  Office # [phone] Pager [phone]

## 2021-06-16 NOTE — Progress Notes (Signed)
Subjective:  Mr. Storey was evaluated and examined at the bedside this morning. He reports that the pain in his heels have improved with prevalon boots. He endorses some left great toe pain which he feels intermittently.   He is saddened by the decision between AKA vs BKA, but he expresses feeling strong enough to proceed through the surgery and recover to the best of his ability. He is thankful for the numerous years he has been able to have his leg. He tearfully shares his sincerest gratitude for the care and treatment he has received so far during his stay at [facility].  He states he has not been eating much recently because he has not had much interest in eating, but states he would enjoy a "good cup of coffee." A fresh cup of coffee with one cream was provided for him.  Objective:  Vital signs in last 24 hours: Vitals:   06/15/21 2310 06/16/21 0355 06/16/21 0753 06/16/21 1159  BP: (!) 139/59 (!) 159/85 (!) 180/75 (!) 142/56  Pulse: 62 61 64 60  Resp: (!) 21 (!) 23 17 20   Temp: 98.4 F (36.9 C) 98.4 F (36.9 C) 98 F (36.7 C) 98.2 F (36.8 C)  TempSrc: Oral Oral Oral Oral  SpO2: 95% 96% 96% 95%  Weight:  96 kg    Height:       Weight change: 2.7 kg  Intake/Output Summary (Last 24 hours) at 06/16/2021 1203 Last data filed at 06/16/2021 08/16/2021 Gross per 24 hour  Intake 2435.64 ml  Output 750 ml  Net 1685.64 ml    Physical Exam: Constitutional: well-appearing and laying in bed, in no acute distress Pulmonary/Chest: nasal canula displaced, normal work of breathing on room air Skin:  Left lower extremity Prevalon boot in place, absence of hair on the left lower extremity at the level of the knee and below, left foot continues to be swollen, erythematous, with wounds and dry gangrene on the first, second, and third toes along with a dorsal ulcer, left hallux is now gray in color, subsequent toes (2-5) are blue/red in color and improve in color moving from 2-5, overall  white/yellow ashy/scaly appearance, heel is boggy when palpated Right lower extremity  Prevalon boot in place Neurological: alert and answering questions appropriately, appropriate insight into his care  Pertinent Labs: CBC    Component Value Date/Time   WBC 4.8 06/16/2021 0055   RBC 3.42 (L) 06/16/2021 0055   HGB 9.9 (L) 06/16/2021 0055   HCT 29.8 (L) 06/16/2021 0055   PLT 199 06/16/2021 0055   MCV 87.1 06/16/2021 0055   MCH 28.9 06/16/2021 0055   MCHC 33.2 06/16/2021 0055   RDW 12.7 06/16/2021 0055   LYMPHSABS 1.3 06/11/2021 1732   MONOABS 0.5 06/11/2021 1732   EOSABS 0.2 06/11/2021 1732   BASOSABS 0.0 06/11/2021 1732   CBG (last 3)  Recent Labs    06/15/21 2345 06/16/21 0354 06/16/21 0759  GLUCAP 129* 131* 122*   Iron/TIBC/Ferritin/ %Sat    Component Value Date/Time   FERRITIN 25 06/15/2021 0303   Assessment/Plan:  Principal Problem:   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 Fib   CAD s/p CABG   CHF   HTN   HLD   DM  Patient Summary: Shreyan Hinz is a 87 year-old man with DM, CAD s/p CABG, HTN, HLD, and blindness who presented with wounds on the  left foot with mild pain and was admitted for management of a left diabetic foot wound with cellulitis/third digit osteomyelitis complicated by vascular disease and failure of outpatient antibiotic and antifungal treatment.  Diabetic foot wound with third digit osteomyelitis and cellulitis complicated by vascular disease Patient presented with left foot cellulitis and dry gangrenous changes on left toes, found to have osteomyelitis of left foot third digit seen on MRI.  Foot infection is complicated by diabetes and vascular disease. Blood cultures are negative at 24/48 hrs and patient is on antibiotic coverage with IV vancomycin, cefepime for pseudomonal coverage, and metronidazole for anaerobic  coverage. Heel pain has improved with utilization of prevalon boots. Arteriogram yesterday revealed multiple occlusions in the left lower extremity and patient does not appear to have good options for endovascular surgery to revascularize the limb. Vascular surgery is following and plans for left AKA vs BKA. Will appreciate their recommendations.  -Continue IV vancomycin, cefepime, and Flagyl until surgery -Start scheduled acetaminophen 1000 mg TID for pain -Continue prevalon for heel protection and pain relief -Resume heparin gtt  Normochromic Normocytic Anemia Patient with a Hgb of 9.9. Reticulocyte count 1.2%. Ferritin 25. Patient is hypoproliferative and iron deficient. IV iron not indicated at this time as patient has active infection. Patient will likely benefit from iron supplementation as an outpatient. -Trend CBC -Transfuse if Hgb < 7.0  Paroxysmal A. Fib On Eliquis at home. Placed on heparin gtt in preparation for procedures/surgeries. -resume heparin gtt  CAD s/p CABG CHF Patient had open heart surgery >20 years ago. -Continue home ASA 81 mg, nitroglycerin 0.4 mg prn, simvastatin 20 mg daily, and torsemide 10 mg daily  HTN HLD -Continue home hydralazine 100 mg TID, losartan 100 mg daily, Coreg 6.25 mg BID -Continue home simvastatin 20 mg daily  DM Patient on NovoLog 70/30 at home, takes 20 units in the morning and 20 units at night. Blood glucose levels have been within tolerable limits since admission. -Continue SSI   LOS: 4 days   Val Eagle, Medical Student 06/16/2021, 12:03 PM

## 2021-06-16 NOTE — Progress Notes (Signed)
ANTICOAGULATION CONSULT NOTE  Pharmacy Consult for heparin/vanc/cefepime Indication: atrial fibrillation/osteo  Allergies  Allergen Reactions   Carbocaine [Mepivacaine]    Cogentin [Benztropine]    Fluogen [Influenza Virus Vaccine]     Flu & pneumonia vaccine   Keflex [Cephalexin]    Kenalog [Triamcinolone]    Levaquin [Levofloxacin]     Patient Measurements: Height: 6\' 1"  (185.4 cm) (stated) Weight: 96 kg (211 lb 10.3 oz) IBW/kg (Calculated) : 79.9 Heparin Dosing Weight: 99.8 kg  Vital Signs: Temp: 98 F (36.7 C) (08/03 0753) Temp Source: Oral (08/03 0753) BP: 180/75 (08/03 0753) Pulse Rate: 64 (08/03 0753)  Labs: Recent Labs    06/14/21 0439 06/14/21 1014 06/14/21 2224 06/15/21 0303 06/15/21 1440 06/16/21 0055  HGB 10.8*  --   --  10.7*  --  9.9*  HCT 32.5*  --   --  31.7*  --  29.8*  PLT 237  --   --  219  --  199  APTT  --  108*   < > 138* 121* 99*  HEPARINUNFRC >1.10* 0.81*  --  0.83*  --  0.48  CREATININE  --   --   --   --   --  1.32*   < > = values in this interval not displayed.     Estimated Creatinine Clearance: 47.2 mL/min (A) (by C-G formula based on SCr of 1.32 mg/dL (H)).  Assessment: 87 y.o. male with h/o Afib, Eliquis on hold, for heparin S/p vascular procedure today  APTT and HL came back therapeutic this AM. They are correlating. He is also on D5 of abx. Scr came back at 1.32 . We will continue with the same dose of abx for now before getting levels since he will get a BKA vs AKA.   Goal of Therapy:  Vanc AUC 400-550 Heparin level 0.3-0.7 units/ml aPTT 66-102 seconds Monitor platelets by anticoagulation protocol: Yes   Plan:  Cont heparin at 1600 units / hr Daily heparin level, CBC Continue vanc 1g IV q24 Change cefepime 2g IV q12 F/u with OR plan  98, PharmD, BCIDP, AAHIVP, CPP Infectious Disease Pharmacist 06/16/2021 8:53 AM

## 2021-06-16 NOTE — Progress Notes (Addendum)
  Progress Note    06/16/2021 8:23 AM 1 Day Post-Op  Subjective:  went to see patient to see if he had any further questions. He is very appreciative of his care. Very tearful about having to make decision about amputation   Vitals:   06/16/21 0355 06/16/21 0753  BP: (!) 159/85 (!) 180/75  Pulse: 61 64  Resp: (!) 23 17  Temp: 98.4 F (36.9 C) 98 F (36.7 C)  SpO2: 96% 96%   Physical Exam: Cardiac:  regular Lungs:  non labored Extremities:  left foot wounds unchanged, bilateral feet in heel protector boots Neurologic: alert and oriented  CBC    Component Value Date/Time   WBC 4.8 06/16/2021 0055   RBC 3.42 (L) 06/16/2021 0055   HGB 9.9 (L) 06/16/2021 0055   HCT 29.8 (L) 06/16/2021 0055   PLT 199 06/16/2021 0055   MCV 87.1 06/16/2021 0055   MCH 28.9 06/16/2021 0055   MCHC 33.2 06/16/2021 0055   RDW 12.7 06/16/2021 0055   LYMPHSABS 1.3 06/11/2021 1732   MONOABS 0.5 06/11/2021 1732   EOSABS 0.2 06/11/2021 1732   BASOSABS 0.0 06/11/2021 1732    BMET    Component Value Date/Time   NA 136 06/16/2021 0055   K 3.6 06/16/2021 0055   CL 106 06/16/2021 0055   CO2 20 (L) 06/16/2021 0055   GLUCOSE 130 (H) 06/16/2021 0055   BUN 27 (H) 06/16/2021 0055   CREATININE 1.32 (H) 06/16/2021 0055   CALCIUM 8.4 (L) 06/16/2021 0055   GFRNONAA 52 (L) 06/16/2021 0055    INR    Component Value Date/Time   INR 1.2 06/12/2021 2016     Intake/Output Summary (Last 24 hours) at 06/16/2021 4742 Last data filed at 06/16/2021 0317 Gross per 24 hour  Intake 2435.64 ml  Output 750 ml  Net 1685.64 ml     Assessment/Plan:  87 y.o. male with peripheral artery disease with cellulitis and gangrene of left 3rd toe. Angiogram yesterday with no endovascular options for revascularization. Not felt to be good candidate for lower extremity bypass for limb salvage. Discussions have been made with patient and his family regarding BKA vs AKA. There is no current urgency to making decisions. He would  like more time to think about how he would like to proceed  Graceann Congress, PA-C [facility] [phone] 06/16/2021 8:23 AM

## 2021-06-17 DIAGNOSIS — I4891 Unspecified atrial fibrillation: Secondary | ICD-10-CM

## 2021-06-17 DIAGNOSIS — E440 Moderate protein-calorie malnutrition: Secondary | ICD-10-CM | POA: Diagnosis present

## 2021-06-17 DIAGNOSIS — Z7901 Long term (current) use of anticoagulants: Secondary | ICD-10-CM

## 2021-06-17 DIAGNOSIS — I70262 Atherosclerosis of native arteries of extremities with gangrene, left leg: Secondary | ICD-10-CM

## 2021-06-17 LAB — GLUCOSE, CAPILLARY
Glucose-Capillary: 111 mg/dL — ABNORMAL HIGH (ref 70–99)
Glucose-Capillary: 117 mg/dL — ABNORMAL HIGH (ref 70–99)
Glucose-Capillary: 120 mg/dL — ABNORMAL HIGH (ref 70–99)
Glucose-Capillary: 126 mg/dL — ABNORMAL HIGH (ref 70–99)
Glucose-Capillary: 135 mg/dL — ABNORMAL HIGH (ref 70–99)
Glucose-Capillary: 143 mg/dL — ABNORMAL HIGH (ref 70–99)

## 2021-06-17 LAB — BASIC METABOLIC PANEL
Anion gap: 11 (ref 5–15)
BUN: 31 mg/dL — ABNORMAL HIGH (ref 8–23)
CO2: 19 mmol/L — ABNORMAL LOW (ref 22–32)
Calcium: 8.3 mg/dL — ABNORMAL LOW (ref 8.9–10.3)
Chloride: 107 mmol/L (ref 98–111)
Creatinine, Ser: 1.63 mg/dL — ABNORMAL HIGH (ref 0.61–1.24)
GFR, Estimated: 40 mL/min — ABNORMAL LOW (ref >60)
Glucose, Bld: 119 mg/dL — ABNORMAL HIGH (ref 70–99)
Potassium: 3.6 mmol/L (ref 3.5–5.1)
Sodium: 137 mmol/L (ref 135–145)

## 2021-06-17 LAB — APTT
aPTT: >200 seconds (ref 24–36)
aPTT: >200 seconds (ref 24–36)

## 2021-06-17 LAB — CBC
HCT: 33.2 % — ABNORMAL LOW (ref 39.0–52.0)
Hemoglobin: 10.9 g/dL — ABNORMAL LOW (ref 13.0–17.0)
MCH: 29 pg (ref 26.0–34.0)
MCHC: 32.8 g/dL (ref 30.0–36.0)
MCV: 88.3 fL (ref 80.0–100.0)
Platelets: 181 10*3/uL (ref 150–400)
RBC: 3.76 MIL/uL — ABNORMAL LOW (ref 4.22–5.81)
RDW: 12.9 % (ref 11.5–15.5)
WBC: 4.1 10*3/uL (ref 4.0–10.5)
nRBC: 0 % (ref 0.0–0.2)

## 2021-06-17 LAB — HEPARIN LEVEL (UNFRACTIONATED)
Heparin Unfractionated: >1.1 IU/mL — ABNORMAL HIGH (ref 0.30–0.70)
Heparin Unfractionated: >1.1 IU/mL — ABNORMAL HIGH (ref 0.30–0.70)
Heparin Unfractionated: >1.1 IU/mL — ABNORMAL HIGH (ref 0.30–0.70)

## 2021-06-17 MED ORDER — TROLAMINE SALICYLATE 10 % EX CREA: TOPICAL_CREAM | CUTANEOUS | Status: DC | PRN

## 2021-06-17 MED ORDER — POLYETHYLENE GLYCOL 3350 17 G PO PACK
17.0000 g | PACK | Freq: Every day | ORAL | Status: DC
  Administered 2021-06-17 – 2021-06-23 (×6): 17 g via ORAL
  Filled 2021-06-17 (×6): qty 1

## 2021-06-17 MED ORDER — HYDROMORPHONE HCL 1 MG/ML IJ SOLN: 0.5000 mg | INTRAMUSCULAR | Status: DC | PRN

## 2021-06-17 MED ORDER — ADULT MULTIVITAMIN W/MINERALS CH
1.0000 | ORAL_TABLET | Freq: Every day | ORAL | Status: DC
  Administered 2021-06-17 – 2021-06-24 (×7): 1 via ORAL
  Filled 2021-06-17 (×7): qty 1

## 2021-06-17 MED ORDER — HEPARIN (PORCINE) 25000 UT/250ML-% IV SOLN: 1600.0000 [IU]/h | INTRAVENOUS | Status: DC

## 2021-06-17 MED ORDER — MUSCLE RUB 10-15 % EX CREA
1.0000 "application " | TOPICAL_CREAM | CUTANEOUS | Status: DC | PRN
  Filled 2021-06-17: qty 85

## 2021-06-17 MED ORDER — OXYCODONE HCL 5 MG PO TABS
5.0000 mg | ORAL_TABLET | Freq: Four times a day (QID) | ORAL | Status: DC | PRN
  Administered 2021-06-17 (×2): 5 mg via ORAL
  Filled 2021-06-17 (×2): qty 1

## 2021-06-17 MED ORDER — SENNOSIDES-DOCUSATE SODIUM 8.6-50 MG PO TABS
1.0000 | ORAL_TABLET | Freq: Two times a day (BID) | ORAL | Status: DC
  Administered 2021-06-17 – 2021-06-23 (×12): 1 via ORAL
  Filled 2021-06-17 (×13): qty 1

## 2021-06-17 MED ORDER — HYDROMORPHONE HCL 1 MG/ML IJ SOLN
0.5000 mg | INTRAMUSCULAR | Status: DC | PRN
Start: 2021-06-17 — End: 2021-06-17
  Administered 2021-06-17: 0.5 mg via INTRAVENOUS
  Filled 2021-06-17: qty 1

## 2021-06-17 MED ORDER — HEPARIN (PORCINE) 25000 UT/250ML-% IV SOLN
1400.0000 [IU]/h | INTRAVENOUS | Status: DC
  Administered 2021-06-17 (×2): 1400 [IU]/h via INTRAVENOUS
  Filled 2021-06-17: qty 250

## 2021-06-17 MED ORDER — ENSURE ENLIVE PO LIQD
237.0000 mL | Freq: Three times a day (TID) | ORAL | Status: DC
  Administered 2021-06-17 – 2021-06-24 (×16): 237 mL via ORAL

## 2021-06-17 NOTE — Progress Notes (Signed)
PT Cancellation Note  Patient Details Name: Cameron Ruiz MRN: [id number] DOB: 1931-05-20   Cancelled Treatment:    Reason Eval/Treat Not Completed: Patient declined, no reason specified.  Pt just having got back in bed from mobilizing with the mobility specialist.  Declines further mobility. 06/17/2021  Jacinto Halim., PT Acute Rehabilitation Services [phone]  (pager) [phone]  (office)   [name] 06/17/2021, 4:53 PM

## 2021-06-17 NOTE — Progress Notes (Signed)
Mobility Specialist: Progress Note   06/17/21 1629  Mobility  Activity Ambulated to bathroom  Level of Assistance Minimal assist, patient does 75% or more  Assistive Device Front wheel walker  Distance Ambulated (ft) 4 ft  Mobility Ambulated with assistance in room  Mobility Response Tolerated well  Mobility performed by Mobility specialist  $Mobility charge 1 Mobility   Pre-Mobility: 66 HR, 154/67 BP, 97% SpO2 Post-Mobility: 67 HR, 149/65 BP, 95% SpO2  Pt required minA to sit EOB as well as to stand. Pt c/o 3/10 pain in his L hip and foot, otherwise asx. Pt was able to step forward to the sink and back to the bed. Some bleeding noted on pt's L foot, RN aware. Pt back in bed with call bell and phone at his side.   [facility] Cameron Ruiz Mobility Specialist Mobility Specialist Phone: [phone]

## 2021-06-17 NOTE — Progress Notes (Signed)
ANTICOAGULATION CONSULT NOTE  Pharmacy Consult for heparin/vanc/cefepime Indication: atrial fibrillation/osteo  Allergies  Allergen Reactions   Carbocaine [Mepivacaine]    Cogentin [Benztropine]    Fluogen [Influenza Virus Vaccine]     Flu & pneumonia vaccine   Keflex [Cephalexin]    Kenalog [Triamcinolone]    Levaquin [Levofloxacin]     Patient Measurements: Height: 6\' 1"  (185.4 cm) (stated) Weight: 92 kg (202 lb 13.2 oz) IBW/kg (Calculated) : 79.9 Heparin Dosing Weight: 99.8 kg  Vital Signs: Temp: 98 F (36.7 C) (08/04 1118) Temp Source: Oral (08/04 1118) BP: 154/61 (08/04 1118) Pulse Rate: 58 (08/04 1118)  Labs: Recent Labs    06/15/21 0303 06/15/21 1440 06/16/21 0055 06/17/21 0659 06/17/21 1044  HGB 10.7*  --  9.9* 10.9*  --   HCT 31.7*  --  29.8* 33.2*  --   PLT 219  --  199 181  --   APTT 138* 121* 99* >200*  --   HEPARINUNFRC 0.83*  --  0.48 >1.10* >1.10*  CREATININE  --   --  1.32* 1.63*  --      Estimated Creatinine Clearance: 35.4 mL/min (A) (by C-G formula based on SCr of 1.63 mg/dL (H)).  Assessment: 87 y.o. male with h/o Afib, Eliquis on hold, for heparin S/p vascular procedure today  Heparin level came back elevated this AM. Repeat level also elevated. Drawn appropriately. He is on D6 of abx. Scr up to 1.63 today. Plan for BKA vs AKA in AM with heparin to be stopped at 0700. We will hold heparin for 2 hrs the reduce rate with the same stoppage time tomorrow.  Hgb stable at 10.9 Plt wnl  Goal of Therapy:  Vanc AUC 400-550 Heparin level 0.3-0.7 Monitor platelets by anticoagulation protocol: Yes   Plan:  Hold heparin x2 hrs Restart heparin at 1400 units/hr Check 8 hr HL Daily heparin level, CBC cont vanc 1g IV q24 Cont cefepime 2g IV q12   98, PharmD, [place], AAHIVP, CPP Infectious Disease Pharmacist 06/17/2021 1:03 PM

## 2021-06-17 NOTE — Progress Notes (Signed)
Initial Nutrition Assessment  DOCUMENTATION CODES:   Non-severe (moderate) malnutrition in context of acute illness/injury  INTERVENTION:   If intake unable to progress in the next 24-48 hours, recommend Cortrak placement if within GOC.   Continue liberalized diet of REGULAR  Ensure Enlive po BID, each supplement provides 350 kcal and 20 grams of protein MVI with minerals daily   NUTRITION DIAGNOSIS:   Moderate Malnutrition related to acute illness as evidenced by energy intake < or equal to 50% for > or equal to 5 days, mild muscle depletion, mild fat depletion.  GOAL:   Patient will meet greater than or equal to 90% of their needs   MONITOR:   PO intake, Supplement acceptance, Weight trends, Labs, I & O's  REASON FOR ASSESSMENT:   Rounds    ASSESSMENT:   Patient with PMH significant for DM, CAD s/p CABG, HTN, HLD, and blindness. Presents this admission with diabetic L foot wound with third digit osteomyelitis.  Plan L BKA tomorrow.   RD approached by nursing to assess PO intake. Per RN, patient's intake significantly declined after hearing news regarding BKA. Patient states he does not feel hungry and is dealing with anxiety. Meal completions charted as 0% since admit. RD attempted to reach internal medicine to possibly get medication to help with anxiety. Waiting to hear back. RD to add supplementation to maximize kcal and protein this admission. Given poor PO intake over the last 5 days, recommend Cortrak placement if intake does not progress in the next 24-48 hours. Patient would likely benefit from palliative meeting as well.   PTA- patient denies loss in appetite. States he consumed 2-3 meals with good protein sources daily. Denies weight loss.   Records lack weight history over the last year.   UOP: 2250 ml x 24 hrs   Medications: SS novolog, demadex Labs: CBG 91-135  NUTRITION - FOCUSED PHYSICAL EXAM:  Flowsheet Row Most Recent Value  Orbital Region No  depletion  Upper Arm Region Mild depletion  Thoracic and Lumbar Region Unable to assess  Buccal Region Mild depletion  Temple Region Mild depletion  Clavicle Bone Region Mild depletion  Clavicle and Acromion Bone Region Mild depletion  Scapular Bone Region Unable to assess  Dorsal Hand Moderate depletion      Diet Order:   Diet Order             Diet NPO time specified Except for: Sips with Meds  Diet effective midnight           Diet regular Room service appropriate? Yes; Fluid consistency: Thin  Diet effective now                   EDUCATION NEEDS:   Education needs have been addressed  Skin:  Skin Assessment: Skin Integrity Issues: Skin Integrity Issues:: Stage II, Unstageable Stage II: buttocks Unstageable: bilateral foot wounds  Last BM:  7/30  Height:   Ht Readings from Last 1 Encounters:  06/12/21 6\' 1"  (1.854 m)    Weight:   Wt Readings from Last 1 Encounters:  06/17/21 92 kg    BMI:  Body mass index is 26.76 kg/m.  Estimated Nutritional Needs:   Kcal:  2000-2200 kcal  Protein:  100-120 grams  Fluid:  >/= 2 L/day  08/17/21 MS, RD, LDN, CNSC Clinical Nutrition Pager listed in AMION

## 2021-06-17 NOTE — Therapy (Signed)
Occupational Therapy Treatment Patient Details Name: Cameron Ruiz MRN: [id number] DOB: [birth year] Today's Date: 06/17/2021    History of present illness Cameron Ruiz is a 87 y.o. male admitted on 06/11/2021 with osteomyelitis of left foot wound. PMH includes diabetes on insulin, cardiovascular disease s/p CABG, HTN, and HLD.   OT comments  Pt with limited participation in occupational therapy this date. Stating generalized pain as 2/10. Pt w/ depressed affect re: pending LE amputation. He was not agreeable to any OOB activity despite maximal encouragement to participate for strength, endurance, wellbeing etc. He also declined sitting at EOB for ADL's as well. He was agreeable to bed level ADL's for grooming and UB bathing. Updated discharge plans but will cont to assess depending on pt participation in skilled OT acutely. Nurse tech was notified of pt status/limited participation.    Follow Up Recommendations  SNF;Supervision - Intermittent;Supervision/Assistance - 24 hour    Equipment Recommendations  Other (comment) (Defer to next venue)    Recommendations for Other Services Other (comment) (Cont to assess following pending surgery - will update as appropriate.)    Precautions / Restrictions Precautions Precautions: Other (comment) Precaution Comments: poor vision       Mobility Bed Mobility Overal bed mobility: Needs Assistance             General bed mobility comments: Repositioning in bed,supervision,  increased time       General transfer comment: Pt declined any and all OOB activities, despite maximal encouragement for participation this morning. Please refer to notes above for ADL's.        ADL either performed or assessed with clinical judgement   ADL Overall ADL's : Needs assistance/impaired     Grooming: Wash/dry hands;Wash/dry face;Modified independent;Set up;Bed level (Pt declined OOB activity today despite increased encouragement for  participation and explaination of OT goals. Pt expressed sadness of pending LE amputation and stated "I just don't want to get up today".)   Upper Body Bathing: Set up;Modified independent;Bed level Upper Body Bathing Details (indicate cue type and reason): Pt declined sitting up at EOB or OOB at sink level for UB bathing today. See grooming notes above.     General ADL Comments: Pt declined OOB to sink and sitting at EOB  today for ADL's. Nursing is aware as pt expressed sadness over pending amputation of LE. Pt was able to participate in bed level grooming and bathing only.    Cognition Arousal/Alertness: Awake/alert Behavior During Therapy: Flat affect;WFL for tasks assessed/performed (Pt expresses sadness PQ:ZRAQTMA amputation) Overall Cognitive Status: Within Functional Limits for tasks assessed       General Comments: Pt expresses sadness over pending LE amputatation, flat affect              General Comments Pt with overall depressed affect with news of pending LE amputation.    Pertinent Vitals/ Pain       Pain Assessment: 0-10 Pain Score: 2  Faces Pain Scale: Hurts a little bit Pain Location: generalized Pain Descriptors / Indicators: Sore;Discomfort Pain Intervention(s): Limited activity within patient's tolerance   Frequency  Min 2X/week        Progress Toward Goals  OT Goals(current goals can now be found in the care plan section)  Progress towards OT goals: Progressing toward goals  Acute Rehab OT Goals Patient Stated Goal: Not stated, OT Goal Formulation: With patient Time For Goal Achievement: 06/27/21 Potential to Achieve Goals: Fair  Plan Discharge plan needs to be updated;Frequency remains appropriate  AM-PAC OT "6 Clicks" Daily Activity     Outcome Measure   Help from another person eating meals?: None Help from another person taking care of personal grooming?: A Little Help from another person toileting, which includes using toliet,  bedpan, or urinal?: A Little Help from another person bathing (including washing, rinsing, drying)?: A Little Help from another person to put on and taking off regular upper body clothing?: None Help from another person to put on and taking off regular lower body clothing?: A Little 6 Click Score: 20    End of Session Equipment Utilized During Treatment: Oxygen  OT Visit Diagnosis: Unsteadiness on feet (R26.81);Muscle weakness (generalized) (M62.81);Pain Pain - part of body:  (Generalized)   Activity Tolerance Other (comment) (Pt limited by depressed affect PF:[id number] LE ampuation this date. He did participate in bed level ADL's briefly.)   Patient Left in bed;with call bell/phone within reach   Nurse Communication Other (comment) (Nurse tech notified of pt limited participation.)        Time: 0810-0824 OT Time Calculation (min): 14 min  Charges: OT General Charges $OT Visit: 1 Visit OT Treatments $Self Care/Home Management : 8-22 mins   Cameron Ruiz Cameron Ruiz, OTR/L 06/17/2021, 8:50 AM

## 2021-06-17 NOTE — Progress Notes (Addendum)
   VASCULAR SURGERY ASSESSMENT & PLAN:   PVD WITH GANGRENE OF THE LEFT THIRD TOE: This patient has extensive wounds of the left foot including a full-thickness wound on the dorsum of the foot.  He is not a candidate for revascularization.  I have recommended primary amputation.  We have discussed above-knee and below-knee amputation.  He is considering trying to get a prosthesis so I think we will give him every benefit of the doubt and attempt amputation below the knee.  I explained that there is a 10 to 15% chance of nonhealing.  His surgery is scheduled for tomorrow.  I have spoken to his granddaughter Efraim Kaufmann and updated her on our plan.  ID: He is on Maxipime, Vanco, and Flagyl.  I would continue these 48 hours postop.  ANTICOAGULATION: His Eliquis is on hold.  He is on this for atrial fibrillation.  He is on IV heparin.  I have stopped this at 7 AM tomorrow in anticipation of surgery in the morning.  I have written preoperative orders.   SUBJECTIVE:   Still having significant left foot pain.  PHYSICAL EXAM:   Vitals:   06/16/21 2326 06/17/21 0329 06/17/21 0500 06/17/21 0726  BP: (!) 146/60 (!) 157/63  (!) 152/63  Pulse: 85 (!) 53  60  Resp: 18 (!) 21  19  Temp: 97.8 F (36.6 C) 98.2 F (36.8 C)  98.2 F (36.8 C)  TempSrc: Oral Oral  Oral  SpO2: 96% 96%  94%  Weight:   92 kg   Height:       No change in the wounds on his left foot.  LABS:   Lab Results  Component Value Date   WBC 4.8 06/16/2021   HGB 9.9 (L) 06/16/2021   HCT 29.8 (L) 06/16/2021   MCV 87.1 06/16/2021   PLT 199 06/16/2021   Lab Results  Component Value Date   CREATININE 1.32 (H) 06/16/2021   Lab Results  Component Value Date   INR 1.2 06/12/2021   CBG (last 3)  Recent Labs    06/16/21 1952 06/16/21 2328 06/17/21 0338  GLUCAP 103* 120* 120*    PROBLEM LIST:    Active Problems:   Cellulitis   Cellulitis of foot, left   Diabetic foot infection (HCC)   Pressure injury of  skin   CURRENT MEDS:    acetaminophen  1,000 mg Oral TID   carvedilol  6.25 mg Oral BID   hydrALAZINE  100 mg Oral TID   insulin aspart  0-9 Units Subcutaneous TID WC   losartan  100 mg Oral Daily   pantoprazole  40 mg Oral Daily   simvastatin  20 mg Oral QHS   sodium chloride flush  10-40 mL Intracatheter Q12H   sodium chloride flush  3 mL Intravenous Q12H   sodium chloride flush  3 mL Intravenous Q12H   torsemide  10 mg Oral q morning    Waverly Ferrari Office: [phone] 06/17/2021

## 2021-06-17 NOTE — H&P (View-Only) (Signed)
   VASCULAR SURGERY ASSESSMENT & PLAN:   PVD WITH GANGRENE OF THE LEFT THIRD TOE: This patient has extensive wounds of the left foot including a full-thickness wound on the dorsum of the foot.  He is not a candidate for revascularization.  I have recommended primary amputation.  We have discussed above-knee and below-knee amputation.  He is considering trying to get a prosthesis so I think we will give him every benefit of the doubt and attempt amputation below the knee.  I explained that there is a 10 to 15% chance of nonhealing.  His surgery is scheduled for tomorrow.  I have spoken to his granddaughter Cameron Ruiz and updated her on our plan.  ID: He is on Maxipime, Vanco, and Flagyl.  I would continue these 48 hours postop.  ANTICOAGULATION: His Eliquis is on hold.  He is on this for atrial fibrillation.  He is on IV heparin.  I have stopped this at 7 AM tomorrow in anticipation of surgery in the morning.  I have written preoperative orders.   SUBJECTIVE:   Still having significant left foot pain.  PHYSICAL EXAM:   Vitals:   06/16/21 2326 06/17/21 0329 06/17/21 0500 06/17/21 0726  BP: (!) 146/60 (!) 157/63  (!) 152/63  Pulse: 85 (!) 53  60  Resp: 18 (!) 21  19  Temp: 97.8 F (36.6 C) 98.2 F (36.8 C)  98.2 F (36.8 C)  TempSrc: Oral Oral  Oral  SpO2: 96% 96%  94%  Weight:   92 kg   Height:       No change in the wounds on his left foot.  LABS:   Lab Results  Component Value Date   WBC 4.8 06/16/2021   HGB 9.9 (L) 06/16/2021   HCT 29.8 (L) 06/16/2021   MCV 87.1 06/16/2021   PLT 199 06/16/2021   Lab Results  Component Value Date   CREATININE 1.32 (H) 06/16/2021   Lab Results  Component Value Date   INR 1.2 06/12/2021   CBG (last 3)  Recent Labs    06/16/21 1952 06/16/21 2328 06/17/21 0338  GLUCAP 103* 120* 120*    PROBLEM LIST:    Active Problems:   Cellulitis   Cellulitis of foot, left   Diabetic foot infection (HCC)   Pressure injury of  skin   CURRENT MEDS:    acetaminophen  1,000 mg Oral TID   carvedilol  6.25 mg Oral BID   hydrALAZINE  100 mg Oral TID   insulin aspart  0-9 Units Subcutaneous TID WC   losartan  100 mg Oral Daily   pantoprazole  40 mg Oral Daily   simvastatin  20 mg Oral QHS   sodium chloride flush  10-40 mL Intracatheter Q12H   sodium chloride flush  3 mL Intravenous Q12H   sodium chloride flush  3 mL Intravenous Q12H   torsemide  10 mg Oral q morning    Tenia Goh Office: [phone] 06/17/2021  

## 2021-06-17 NOTE — Care Management Important Message (Signed)
Important Message  Patient Details  Name: Cameron Ruiz MRN: [id number] Date of Birth: 04-16-1931   Medicare Important Message Given:  Yes     Renie Ora 06/17/2021, 10:26 AM

## 2021-06-17 NOTE — Progress Notes (Signed)
Subjective:  Mr. Camino was evaluated and examined at the bedside this morning. He reports that he is not in pain today. Although he is sad about having an amputation, he has spoken with the vascular surgeon and is prepared to move forward with surgery. He is concerned about what his functional status will be after having his leg amputated, and he is hopeful he will be able to regain function and eventually return home with his wife. He states he has not been eating or drinking a significant amount because he has little desire to eat or drink.  Objective:  Vital signs in last 24 hours: Vitals:   06/16/21 2326 06/17/21 0329 06/17/21 0500 06/17/21 0726  BP: (!) 146/60 (!) 157/63  (!) 152/63  Pulse: 85 (!) 53  60  Resp: 18 (!) 21  19  Temp: 97.8 F (36.6 C) 98.2 F (36.8 C)  98.2 F (36.8 C)  TempSrc: Oral Oral  Oral  SpO2: 96% 96%  94%  Weight:   92 kg   Height:       Weight change: -4 kg  Intake/Output Summary (Last 24 hours) at 06/17/2021 1047 Last data filed at 06/17/2021 9326 Gross per 24 hour  Intake 1259.07 ml  Output 2250 ml  Net -990.93 ml    Physical Exam: Constitutional: well-appearing and laying in bed, in no acute distress Pulmonary/Chest: nasal canula in place, normal work of breathing Skin: nail beds are pale, poor skin turgor Extremities:  Left lower extremity Prevalon boot in place, no significant changes in coloration from yesterday, left heel remains boggy but skin is intact, dry gangrene on first, second, and third toes Right lower extremity  Prevalon boot in place Psych: patient is tearful and is saddened at the reality of needing amputation, he expresses concern over his functional status after amputation and states he has not had significant oral intake due to disinterest  Pertinent Labs: CBC    Component Value Date/Time   WBC 4.1 06/17/2021 0659   RBC 3.76 (L) 06/17/2021 0659   HGB 10.9 (L) 06/17/2021 0659   HCT 33.2 (L) 06/17/2021 0659   PLT  181 06/17/2021 0659   MCV 88.3 06/17/2021 0659   MCH 29.0 06/17/2021 0659   MCHC 32.8 06/17/2021 0659   RDW 12.9 06/17/2021 0659   LYMPHSABS 1.3 06/11/2021 1732   MONOABS 0.5 06/11/2021 1732   EOSABS 0.2 06/11/2021 1732   BASOSABS 0.0 06/11/2021 1732   BMP Latest Ref Rng & Units 06/17/2021 06/16/2021 06/13/2021  Glucose 70 - 99 mg/dL 712(W) 580(D) 983(J)  BUN 8 - 23 mg/dL 82(N) 05(L) 19  Creatinine 0.61 - 1.24 mg/dL 9.76(B) 3.41(P) 3.79  Sodium 135 - 145 mmol/L 137 136 138  Potassium 3.5 - 5.1 mmol/L 3.6 3.6 4.2  Chloride 98 - 111 mmol/L 107 106 106  CO2 22 - 32 mmol/L 19(L) 20(L) 24  Calcium 8.9 - 10.3 mg/dL 8.3(L) 8.4(L) 8.8(L)   CBG (last 3)  Recent Labs    06/16/21 2328 06/17/21 0338 06/17/21 0810  GLUCAP 120* 120* 111*   Assessment/Plan:  Principal Problem:   Left diabetic foot wound with third digit osteomyelitis   Active Problems:   Limb Threatening Ischemia of the Left Lower Extremity 2/2 Severe PAD   Bilateral pressure wounds on heels   Iron Deficiency Anemia   Paroxysmal A. Fib   CAD s/p CABG   CHF   HTN   HLD   DM  Patient Summary: Hale Chalfin is a 87 year-old man  with DM, CAD s/p CABG, HTN, HLD, and blindness who presented with painful wounds on the left foot and was admitted for management of a left diabetic foot wound with cellulitis/third digit osteomyelitis complicated by limb threatening ischemia secondary to severe PAD.  Left Diabetic foot wound with third digit osteomyelitis Patient is on day 7 of antibiotic treatment and is currently receiving IV vancomycin, cefepime for pseudomonal coverage, and metronidazole for anaerobic coverage. He still has multiple gangrenous toes and his potential for healing is complicated by limb threatening ischemia found via arteriogram. Vascular surgery is following and plans for left BKA tomorrow. Will appreciate their recommendations.  -Hold heparin gtt at 07:00 tomorrow in anticipation of surgery -IV vancomycin,  cefepime, and Flagyl until 48 hrs postop -Continue pain management -acetaminophen 1000 mg TID -oxycodone-acetaminophen 5-325 q6 hrs prn for moderate pain -tramadol 50 mg q6 hrs prn for moderate pain -dilaudid 0.5 mg q4 hrs prn for severe pain -Continue prevalon for heel protection and pain relief -PT/OT recommending SNF and will consult TOC for SNF placement  Limb Threatening Ischemia of the LLE Arteriogram revealed multiple occlusions in the left lower extremity (superficial femoral, poplitea) and patient does not appear to have good options for endovascular surgery to revascularize the limb. Vascular surgery is following and plans for left BKA. -See above  Normochromic Normocytic Anemia Patient with a Hgb of 10.9. Reticulocyte count 1.2%. Ferritin 25. Patient is hypoproliferative and iron deficient. IV iron not indicated at this time as patient has active infection. Patient will likely benefit from iron supplementation as an outpatient. -Trend CBC -Transfuse if Hgb < 7.0  Paroxysmal A. Fib On Eliquis at home. Placed on heparin gtt in preparation for procedures/surgeries. -continue holding home Eliquis -hold heparin gtt at 07:00 tomorrow in anticipation of surgery  CAD s/p CABG CHF Patient had open heart surgery >20 years ago. -Continue home ASA 81 mg, nitroglycerin 0.4 mg prn, simvastatin 20 mg daily, and torsemide 10 mg daily  HTN HLD -Continue home hydralazine 100 mg TID, losartan 100 mg daily, Coreg 6.25 mg BID -Continue home simvastatin 20 mg daily  DM Patient on NovoLog 70/30 at home, takes 20 units in the morning and 20 units at night. Blood glucose levels have been within tolerable limits since admission. -Continue SSI   LOS: 5 days   Val Eagle, Medical Student 06/17/2021, 10:47 AM

## 2021-06-18 ENCOUNTER — Inpatient Hospital Stay (HOSPITAL_COMMUNITY): Payer: Medicare Other | Admitting: Anesthesiology

## 2021-06-18 ENCOUNTER — Encounter (HOSPITAL_COMMUNITY): Payer: Self-pay | Admitting: Internal Medicine

## 2021-06-18 ENCOUNTER — Encounter (HOSPITAL_COMMUNITY)
Admission: EM | Disposition: A | Discharge status: Skilled Nursing Facility | Payer: Self-pay | Source: Home / Self Care | Attending: Internal Medicine

## 2021-06-18 DIAGNOSIS — I70262 Atherosclerosis of native arteries of extremities with gangrene, left leg: Secondary | ICD-10-CM

## 2021-06-18 DIAGNOSIS — E11628 Type 2 diabetes mellitus with other skin complications: Secondary | ICD-10-CM | POA: Diagnosis not present

## 2021-06-18 DIAGNOSIS — L089 Local infection of the skin and subcutaneous tissue, unspecified: Secondary | ICD-10-CM | POA: Diagnosis not present

## 2021-06-18 HISTORY — PX: AMPUTATION: SHX166

## 2021-06-18 LAB — CBC
HCT: 32.7 % — ABNORMAL LOW (ref 39.0–52.0)
Hemoglobin: 10.8 g/dL — ABNORMAL LOW (ref 13.0–17.0)
MCH: 29.4 pg (ref 26.0–34.0)
MCHC: 33 g/dL (ref 30.0–36.0)
MCV: 89.1 fL (ref 80.0–100.0)
Platelets: 182 10*3/uL (ref 150–400)
RBC: 3.67 MIL/uL — ABNORMAL LOW (ref 4.22–5.81)
RDW: 12.9 % (ref 11.5–15.5)
WBC: 4.5 10*3/uL (ref 4.0–10.5)
nRBC: 0 % (ref 0.0–0.2)

## 2021-06-18 LAB — BASIC METABOLIC PANEL
Anion gap: 10 (ref 5–15)
BUN: 38 mg/dL — ABNORMAL HIGH (ref 8–23)
CO2: 19 mmol/L — ABNORMAL LOW (ref 22–32)
Calcium: 8.2 mg/dL — ABNORMAL LOW (ref 8.9–10.3)
Chloride: 109 mmol/L (ref 98–111)
Creatinine, Ser: 1.86 mg/dL — ABNORMAL HIGH (ref 0.61–1.24)
GFR, Estimated: 34 mL/min — ABNORMAL LOW (ref >60)
Glucose, Bld: 118 mg/dL — ABNORMAL HIGH (ref 70–99)
Potassium: 3.5 mmol/L (ref 3.5–5.1)
Sodium: 138 mmol/L (ref 135–145)

## 2021-06-18 LAB — GLUCOSE, CAPILLARY
Glucose-Capillary: 111 mg/dL — ABNORMAL HIGH (ref 70–99)
Glucose-Capillary: 114 mg/dL — ABNORMAL HIGH (ref 70–99)
Glucose-Capillary: 144 mg/dL — ABNORMAL HIGH (ref 70–99)
Glucose-Capillary: 146 mg/dL — ABNORMAL HIGH (ref 70–99)
Glucose-Capillary: 150 mg/dL — ABNORMAL HIGH (ref 70–99)
Glucose-Capillary: 151 mg/dL — ABNORMAL HIGH (ref 70–99)

## 2021-06-18 LAB — HEPARIN LEVEL (UNFRACTIONATED): Heparin Unfractionated: 0.72 IU/mL — ABNORMAL HIGH (ref 0.30–0.70)

## 2021-06-18 LAB — SURGICAL PCR SCREEN
MRSA, PCR: NEGATIVE
Staphylococcus aureus: NEGATIVE

## 2021-06-18 SURGERY — AMPUTATION BELOW KNEE
Anesthesia: Regional | Site: Knee | Laterality: Left

## 2021-06-18 MED ORDER — EPHEDRINE SULFATE-NACL 50-0.9 MG/10ML-% IV SOSY
PREFILLED_SYRINGE | INTRAVENOUS | Status: DC | PRN
  Administered 2021-06-18 (×5): 5 mg via INTRAVENOUS

## 2021-06-18 MED ORDER — MIDAZOLAM HCL 2 MG/2ML IJ SOLN
INTRAMUSCULAR | Status: AC
  Filled 2021-06-18: qty 2

## 2021-06-18 MED ORDER — LIDOCAINE 2% (20 MG/ML) 5 ML SYRINGE
INTRAMUSCULAR | Status: DC | PRN
  Administered 2021-06-18: 20 mg via INTRAVENOUS

## 2021-06-18 MED ORDER — 0.9 % SODIUM CHLORIDE (POUR BTL) OPTIME
TOPICAL | Status: DC | PRN
  Administered 2021-06-18: 1000 mL

## 2021-06-18 MED ORDER — LACTATED RINGERS IV SOLN: INTRAVENOUS | Status: DC

## 2021-06-18 MED ORDER — FENTANYL CITRATE (PF) 250 MCG/5ML IJ SOLN
INTRAMUSCULAR | Status: AC
  Filled 2021-06-18: qty 5

## 2021-06-18 MED ORDER — BUPIVACAINE LIPOSOME 1.3 % IJ SUSP
INTRAMUSCULAR | Status: DC | PRN
  Administered 2021-06-18: 10 mL via PERINEURAL

## 2021-06-18 MED ORDER — BUPIVACAINE HCL (PF) 0.5 % IJ SOLN
INTRAMUSCULAR | Status: DC | PRN
  Administered 2021-06-18: 15 mL via PERINEURAL

## 2021-06-18 MED ORDER — FENTANYL CITRATE (PF) 100 MCG/2ML IJ SOLN: 25.0000 ug | INTRAMUSCULAR | Status: DC | PRN

## 2021-06-18 MED ORDER — HEPARIN (PORCINE) 25000 UT/250ML-% IV SOLN
1000.0000 [IU]/h | INTRAVENOUS | Status: DC
  Administered 2021-06-18: 1000 [IU]/h via INTRAVENOUS

## 2021-06-18 MED ORDER — HEPARIN (PORCINE) 25000 UT/250ML-% IV SOLN
500.0000 [IU]/h | INTRAVENOUS | Status: DC
  Administered 2021-06-18: 500 [IU]/h via INTRAVENOUS
  Filled 2021-06-18: qty 250

## 2021-06-18 MED ORDER — BACITRACIN ZINC 500 UNIT/GM EX OINT
TOPICAL_OINTMENT | CUTANEOUS | Status: DC | PRN
  Administered 2021-06-18: 1 via TOPICAL

## 2021-06-18 MED ORDER — HYDROMORPHONE HCL 1 MG/ML IJ SOLN
0.5000 mg | INTRAMUSCULAR | Status: AC | PRN
  Administered 2021-06-18 (×2): 1 mg via INTRAVENOUS
  Filled 2021-06-18 (×2): qty 1

## 2021-06-18 MED ORDER — ACETAMINOPHEN 10 MG/ML IV SOLN: 1000.0000 mg | Freq: Once | INTRAVENOUS | Status: DC | PRN

## 2021-06-18 MED ORDER — ORAL CARE MOUTH RINSE: 15.0000 mL | Freq: Once | OROMUCOSAL | Status: AC

## 2021-06-18 MED ORDER — ROPIVACAINE HCL 5 MG/ML IJ SOLN
INTRAMUSCULAR | Status: DC | PRN
  Administered 2021-06-18: 15 mL via PERINEURAL

## 2021-06-18 MED ORDER — ACETAMINOPHEN 10 MG/ML IV SOLN
INTRAVENOUS | Status: AC
  Administered 2021-06-18: 1000 mg
  Filled 2021-06-18: qty 100

## 2021-06-18 MED ORDER — PROPOFOL 10 MG/ML IV BOLUS
INTRAVENOUS | Status: DC | PRN
  Administered 2021-06-18: 100 mg via INTRAVENOUS

## 2021-06-18 MED ORDER — PHENYLEPHRINE HCL-NACL 20-0.9 MG/250ML-% IV SOLN
INTRAVENOUS | Status: DC | PRN
  Administered 2021-06-18: 30 ug/min via INTRAVENOUS

## 2021-06-18 MED ORDER — ONDANSETRON HCL 4 MG/2ML IJ SOLN
INTRAMUSCULAR | Status: DC | PRN
  Administered 2021-06-18: 4 mg via INTRAVENOUS

## 2021-06-18 MED ORDER — TORSEMIDE 20 MG PO TABS
10.0000 mg | ORAL_TABLET | Freq: Every morning | ORAL | Status: DC
  Administered 2021-06-20 – 2021-06-22 (×3): 10 mg via ORAL
  Filled 2021-06-18 (×3): qty 1

## 2021-06-18 MED ORDER — FENTANYL CITRATE (PF) 100 MCG/2ML IJ SOLN
INTRAMUSCULAR | Status: AC
  Administered 2021-06-18: 100 ug via INTRAVENOUS
  Filled 2021-06-18: qty 2

## 2021-06-18 MED ORDER — ONDANSETRON HCL 4 MG/2ML IJ SOLN: 4.0000 mg | Freq: Once | INTRAMUSCULAR | Status: DC | PRN

## 2021-06-18 MED ORDER — CHLORHEXIDINE GLUCONATE 0.12 % MT SOLN
15.0000 mL | Freq: Once | OROMUCOSAL | Status: AC
  Administered 2021-06-18: 15 mL via OROMUCOSAL

## 2021-06-18 MED ORDER — SODIUM CHLORIDE 0.9 % IV SOLN: INTRAVENOUS | Status: AC

## 2021-06-18 MED ORDER — OXYCODONE HCL 5 MG PO TABS
5.0000 mg | ORAL_TABLET | ORAL | Status: DC | PRN
Start: 2021-06-18 — End: 2021-06-19
  Administered 2021-06-19: 5 mg via ORAL
  Filled 2021-06-18: qty 1

## 2021-06-18 MED ORDER — BACITRACIN ZINC 500 UNIT/GM EX OINT
TOPICAL_OINTMENT | CUTANEOUS | Status: AC
  Filled 2021-06-18: qty 28.35

## 2021-06-18 MED ORDER — FENTANYL CITRATE (PF) 100 MCG/2ML IJ SOLN: 100.0000 ug | Freq: Once | INTRAMUSCULAR | Status: AC

## 2021-06-18 MED ORDER — PHENYLEPHRINE HCL (PRESSORS) 10 MG/ML IV SOLN
INTRAVENOUS | Status: DC | PRN
  Administered 2021-06-18 (×5): 80 ug via INTRAVENOUS

## 2021-06-18 MED ORDER — LACTATED RINGERS IV SOLN: INTRAVENOUS | Status: DC | PRN

## 2021-06-18 MED ORDER — FENTANYL CITRATE (PF) 250 MCG/5ML IJ SOLN
INTRAMUSCULAR | Status: DC | PRN
  Administered 2021-06-18 (×4): 25 ug via INTRAVENOUS

## 2021-06-18 SURGICAL SUPPLY — 54 items
BAG COUNTER SPONGE SURGICOUNT (BAG) ×2 IMPLANT
BANDAGE ESMARK 6X9 LF (GAUZE/BANDAGES/DRESSINGS) ×1 IMPLANT
BLADE SAW RECIP 87.9 MT (BLADE) ×2 IMPLANT
BNDG COHESIVE 6X5 TAN STRL LF (GAUZE/BANDAGES/DRESSINGS) ×2 IMPLANT
BNDG ELASTIC 4X5.8 VLCR STR LF (GAUZE/BANDAGES/DRESSINGS) ×4 IMPLANT
BNDG ESMARK 6X9 LF (GAUZE/BANDAGES/DRESSINGS) ×2
BNDG GAUZE ELAST 4 BULKY (GAUZE/BANDAGES/DRESSINGS) ×3 IMPLANT
CANISTER SUCT 3000ML PPV (MISCELLANEOUS) ×4 IMPLANT
CLIP VESOCCLUDE MED 6/CT (CLIP) IMPLANT
COVER SURGICAL LIGHT HANDLE (MISCELLANEOUS) ×2 IMPLANT
CUFF TOURN SGL QUICK 24 (TOURNIQUET CUFF)
CUFF TOURN SGL QUICK 34 (TOURNIQUET CUFF)
CUFF TOURN SGL QUICK 42 (TOURNIQUET CUFF) IMPLANT
CUFF TRNQT CYL 24X4X16.5-23 (TOURNIQUET CUFF) IMPLANT
CUFF TRNQT CYL 34X4.125X (TOURNIQUET CUFF) IMPLANT
DRAIN CHANNEL 19F RND (DRAIN) IMPLANT
DRAPE HALF SHEET 40X57 (DRAPES) ×2 IMPLANT
DRAPE ORTHO SPLIT 77X108 STRL (DRAPES) ×2
DRAPE SURG ORHT 6 SPLT 77X108 (DRAPES) ×2 IMPLANT
DRAPE U-SHAPE 47X51 STRL (DRAPES) ×2 IMPLANT
DRSG ADAPTIC 3X8 NADH LF (GAUZE/BANDAGES/DRESSINGS) ×2 IMPLANT
ELECT REM PT RETURN 9FT ADLT (ELECTROSURGICAL) ×2
ELECTRODE REM PT RTRN 9FT ADLT (ELECTROSURGICAL) ×1 IMPLANT
EVACUATOR SILICONE 100CC (DRAIN) IMPLANT
GAUZE SPONGE 4X4 12PLY STRL (GAUZE/BANDAGES/DRESSINGS) ×4 IMPLANT
GLOVE SRG 8 PF TXTR STRL LF DI (GLOVE) ×1 IMPLANT
GLOVE SURG ENC MOIS LTX SZ7.5 (GLOVE) ×2 IMPLANT
GLOVE SURG POLYISO LF SZ6.5 (GLOVE) ×2 IMPLANT
GLOVE SURG UNDER POLY LF SZ6.5 (GLOVE) ×3 IMPLANT
GLOVE SURG UNDER POLY LF SZ8 (GLOVE) ×1
GOWN STRL NON-REIN LRG LVL3 (GOWN DISPOSABLE) ×2 IMPLANT
GOWN STRL REUS W/ TWL LRG LVL3 (GOWN DISPOSABLE) ×3 IMPLANT
GOWN STRL REUS W/TWL LRG LVL3 (GOWN DISPOSABLE) ×3
KIT BASIN OR (CUSTOM PROCEDURE TRAY) ×2 IMPLANT
KIT TURNOVER KIT B (KITS) ×2 IMPLANT
NS IRRIG 1000ML POUR BTL (IV SOLUTION) ×2 IMPLANT
PACK GENERAL/GYN (CUSTOM PROCEDURE TRAY) ×2 IMPLANT
PAD ARMBOARD 7.5X6 YLW CONV (MISCELLANEOUS) ×4 IMPLANT
RASP HELIOCORDIAL MED (MISCELLANEOUS) IMPLANT
SPONGE T-LAP 18X18 ~~LOC~~+RFID (SPONGE) ×3 IMPLANT
STAPLER VISISTAT (STAPLE) ×2 IMPLANT
STOCKINETTE IMPERVIOUS LG (DRAPES) ×2 IMPLANT
SUT ETHILON 3 0 PS 1 (SUTURE) IMPLANT
SUT SILK 0 TIES 10X30 (SUTURE) ×2 IMPLANT
SUT SILK 2 0 (SUTURE) ×1
SUT SILK 2 0 SH (SUTURE) ×1 IMPLANT
SUT SILK 2 0 SH CR/8 (SUTURE) ×3 IMPLANT
SUT SILK 2-0 18XBRD TIE 12 (SUTURE) ×1 IMPLANT
SUT SILK 3 0 (SUTURE) ×1
SUT SILK 3-0 18XBRD TIE 12 (SUTURE) ×1 IMPLANT
SUT VIC AB 2-0 CT1 18 (SUTURE) ×2 IMPLANT
TOWEL GREEN STERILE (TOWEL DISPOSABLE) ×4 IMPLANT
UNDERPAD 30X36 HEAVY ABSORB (UNDERPADS AND DIAPERS) ×2 IMPLANT
WATER STERILE IRR 1000ML POUR (IV SOLUTION) ×2 IMPLANT

## 2021-06-18 NOTE — Transfer of Care (Signed)
Immediate Anesthesia Transfer of Care Note  Patient: Cameron Ruiz  Procedure(s) Performed: LEFT BELOW KNEE AMPUTATION (Left: Knee)  Patient Location: PACU  Anesthesia Type:GA combined with regional for post-op pain  Level of Consciousness: awake, alert  and patient cooperative  Airway & Oxygen Therapy: Patient Spontanous Breathing and Patient connected to nasal cannula oxygen  Post-op Assessment: Report given to RN and Post -op Vital signs reviewed and stable  Post vital signs: Reviewed and stable  Last Vitals:  Vitals Value Taken Time  BP 150/65 06/18/21 1241  Temp    Pulse 65 06/18/21 1244  Resp 21 06/18/21 1244  SpO2 92 % 06/18/21 1244  Vitals shown include unvalidated device data.  Last Pain:  Vitals:   06/18/21 1040  TempSrc:   PainSc: 0-No pain      Patients Stated Pain Goal: 2 (06/14/21 2111)  Complications: No notable events documented.

## 2021-06-18 NOTE — Anesthesia Postprocedure Evaluation (Signed)
Anesthesia Post Note  Patient: Cameron Ruiz  Procedure(s) Performed: LEFT BELOW KNEE AMPUTATION (Left: Knee)     Patient location during evaluation: PACU Anesthesia Type: Regional and General Level of consciousness: awake and alert Pain management: pain level controlled Vital Signs Assessment: post-procedure vital signs reviewed and stable Respiratory status: spontaneous breathing, nonlabored ventilation, respiratory function stable and patient connected to nasal cannula oxygen Cardiovascular status: blood pressure returned to baseline and stable Postop Assessment: no apparent nausea or vomiting Anesthetic complications: no   No notable events documented.  Last Vitals:  Vitals:   06/18/21 1325 06/18/21 1421  BP: (!) 142/53 (!) 142/60  Pulse: (!) 59 60  Resp: 17 17  Temp: 36.6 C 36.6 C  SpO2: 93% 97%    Last Pain:  Vitals:   06/18/21 1421  TempSrc: Oral  PainSc: 0-No pain                 Trevor Iha

## 2021-06-18 NOTE — Progress Notes (Signed)
  Date: 06/18/2021  Patient name: BLESSED COTHAM  Medical record number: [id number]  Date of birth: [birth year]        I have seen and evaluated this patient and I have discussed the plan of care with the house staff. Please see Dr. Ruben Im and MS3 [place]'s note for complete details. I concur with their findings and plan.   Inez Catalina, MD 06/18/2021, 8:57 PM

## 2021-06-18 NOTE — Op Note (Signed)
    NAME: Cameron Ruiz    MRN: [id number] DOB: [birth year]    DATE OF OPERATION: 06/18/2021  PREOP DIAGNOSIS:    Gangrene of left leg with peripheral vascular disease  POSTOP DIAGNOSIS:    Same  PROCEDURE:    Left below the knee amputation  SURGEON: Di Kindle. Edilia Bo, MD  ASSIST: Aggie Moats, PA  ANESTHESIA: General  EBL: Minimal  INDICATIONS:    Cameron Ruiz is a 87 y.o. male who presented with extensive wounds in the left foot.  Underwent an arteriogram was not a candidate for revascularization.  He presents for left below the knee amputation.  FINDINGS:   The muscle looked well perfused.  The lateral compartment however looks chronically ischemic.  TECHNIQUE:   The patient was taken to the operating room and received of general anesthesia.  The left leg was prepped and draped in usual sterile fashion.  A tourniquet had been placed on the upper thigh.  The circumference of the limb was measured 10 cm distal to the tibial tuberosity and two thirds of this distance was used to mark the anterior skin flap.  A long posterior flap of equal length was marked.  The leg was exsanguinated with an Esmarch bandage.  The tourniquet was inflated to 300 mmHg.  Under tourniquet control incision was carried down to the skin, subcutaneous tissue, fascia and muscle to the tibia and fibula which were dissected free circumferentially.  The periosteum was elevated.  The bone was divided proximal to the level of skin division.  The anterior aspect of the tibia was beveled.  The arteries and veins were individually suture ligated with 2-0 silk ties.  The tourniquet was then released.  Additional hemostasis was obtained using electrocautery.  The edges of the bone were rasped.  The wound was irrigated with copious amounts of saline.  The fascial layer was then closed with interrupted 2-0 Vicryl's.  The skin was closed with staples.  A sterile dressing was applied.  The patient tolerated the  procedure well was transferred to the recovery room in stable condition.  All needle and sponge counts were correct.  Given the complexity of the case a first assistant was necessary in order to expedient the procedure and safely perform the technical aspects of the operation.  Waverly Ferrari, MD, FACS [facility]  DATE OF DICTATION:   06/18/2021

## 2021-06-18 NOTE — Progress Notes (Signed)
ANTICOAGULATION CONSULT NOTE  Pharmacy Consult for heparin Indication: atrial fibrillation  Labs: Recent Labs    06/15/21 0303 06/15/21 1440 06/16/21 0055 06/17/21 0659 06/17/21 1044 06/17/21 2249  HGB 10.7*  --  9.9* 10.9*  --   --   HCT 31.7*  --  29.8* 33.2*  --   --   PLT 219  --  199 181  --   --   APTT 138*   < > 99* >200* >200*  --   HEPARINUNFRC 0.83*  --  0.48 >1.10* >1.10* >1.10*  CREATININE  --   --  1.32* 1.63*  --   --    < > = values in this interval not displayed.   Assessment: 87 y.o. male with h/o Afib, Eliquis on hold, for heparin S/p vascular procedure today  Heparin level came back elevated this AM. Repeat level also elevated. Drawn appropriately.   Goal of Therapy:  Heparin level 0.3-0.7 Monitor platelets by anticoagulation protocol: Yes   Plan:  Hold heparin x2 hrs Restart heparin at 1000 units/hr Heparin off at 0700 for OR  Thanks for allowing pharmacy to be a part of this patient's care.  Talbert Cage, PharmD Clinical Pharmacist  06/18/2021 12:00 AM

## 2021-06-18 NOTE — Progress Notes (Signed)
Pt returned from PACU.  Pt is A&O to self and situation only.  Pt is neuro intact.  Vitals taken and all within normal range.  L leg and BKA is warm with no discoloration.  Pulses palpable.  Pt is currently comfortable and not in pain.

## 2021-06-18 NOTE — Progress Notes (Addendum)
ANTICOAGULATION CONSULT NOTE  Pharmacy Consult for heparin Indication: atrial fibrillation  Labs: Recent Labs    06/16/21 0055 06/17/21 0659 06/17/21 1044 06/17/21 2249 06/18/21 0138  HGB 9.9* 10.9*  --   --  10.8*  HCT 29.8* 33.2*  --   --  32.7*  PLT 199 181  --   --  182  APTT 99* >200* >200*  --   --   HEPARINUNFRC 0.48 >1.10* >1.10* >1.10* 0.72*  CREATININE 1.32* 1.63*  --   --  1.86*   Assessment: 87 y.o. male with h/o Afib, Eliquis on hold, for heparin S/p vascular procedure today  S/p L BKA 8/5 today - Heparin level was higher earlier at 0.72, after 4 hours on heparin at 1000 units/hr. Hgb 10.8, plt 182. No s/sx of bleeding. Talked with vasular PA, to reduce risk of hematoma post-procedure will order heparin 500 units/hr fixed for now.   Goal of Therapy:  Heparin level 0.3-0.7  Monitor platelets by anticoagulation protocol: Yes   Plan:  Restart heparin infusion at 500 units/hr on 8/5@1800  Monitor daily HL, CBC, and for s/sx of bleeding   Thanks for allowing pharmacy to be a part of this patient's care.  Sherron Monday, PharmD, BCCCP Clinical Pharmacist  Phone: [phone] 06/18/2021 3:25 PM  Please check AMION for all [facility]acy phone numbers After 10:00 PM, call Main Pharmacy [phone]

## 2021-06-18 NOTE — Interval H&P Note (Signed)
History and Physical Interval Note:  06/18/2021 10:59 AM  Cameron Ruiz  has presented today for surgery, with the diagnosis of PVD WITH GANGRENE.  The various methods of treatment have been discussed with the patient and family. After consideration of risks, benefits and other options for treatment, the patient has consented to  Procedure(s): LEFT BELOW KNEE AMPUTATION (Left) as a surgical intervention.  The patient's history has been reviewed, patient examined, no change in status, stable for surgery.  I have reviewed the patient's chart and labs.  Questions were answered to the patient's satisfaction.     Waverly Ferrari

## 2021-06-18 NOTE — Anesthesia Procedure Notes (Signed)
Anesthesia Regional Block: Adductor canal block   Pre-Anesthetic Checklist: , timeout performed,  Correct Patient, Correct Site, Correct Laterality,  Correct Procedure, Correct Position, site marked,  Risks and benefits discussed,  Surgical consent,  Pre-op evaluation,  At surgeon's request and post-op pain management  Laterality: Left  Prep: Maximum Sterile Barrier Precautions used, chloraprep       Needles:  Injection technique: Single-shot  Needle Type: Echogenic Stimulator Needle     Needle Length: 9cm  Needle Gauge: 22     Additional Needles:   Procedures:,,,, ultrasound used (permanent image in chart),,    Narrative:  Start time: 06/18/2021 10:37 AM End time: 06/18/2021 10:41 AM Injection made incrementally with aspirations every 5 mL.  Performed by: Personally  Anesthesiologist: Elmer Picker, MD  Additional Notes: Monitors applied. No increased pain on injection. No increased resistance to injection. Injection made in 5cc increments. Good needle visualization. Patient tolerated procedure well.

## 2021-06-18 NOTE — Anesthesia Procedure Notes (Addendum)
Anesthesia Regional Block: Popliteal block   Pre-Anesthetic Checklist: , timeout performed,  Correct Patient, Correct Site, Correct Laterality,  Correct Procedure, Correct Position, site marked,  Risks and benefits discussed,  Surgical consent,  Pre-op evaluation,  At surgeon's request and post-op pain management  Laterality: Left  Prep: Maximum Sterile Barrier Precautions used, chloraprep       Needles:  Injection technique: Single-shot  Needle Type: Echogenic Stimulator Needle     Needle Length: 9cm  Needle Gauge: 22     Additional Needles:   Procedures:,,,, ultrasound used (permanent image in chart),,    Narrative:  Start time: 06/18/2021 10:31 AM End time: 06/18/2021 10:37 AM Injection made incrementally with aspirations every 5 mL.  Performed by: Personally  Anesthesiologist: Elmer Picker, MD  Additional Notes: Monitors applied. No increased pain on injection. No increased resistance to injection. Injection made in 5cc increments. Good needle visualization. Patient tolerated procedure well.

## 2021-06-18 NOTE — Anesthesia Procedure Notes (Signed)
Procedure Name: LMA Insertion Date/Time: 06/18/2021 11:23 AM Performed by: Nadara Mustard, CRNA Pre-anesthesia Checklist: Patient identified, Emergency Drugs available, Suction available, Patient being monitored and Timeout performed Patient Re-evaluated:Patient Re-evaluated prior to induction Oxygen Delivery Method: Circle system utilized Preoxygenation: Pre-oxygenation with 100% oxygen Induction Type: IV induction LMA: LMA inserted LMA Size: 4.0 Placement Confirmation: breath sounds checked- equal and bilateral and positive ETCO2 Tube secured with: Tape Dental Injury: Teeth and Oropharynx as per pre-operative assessment

## 2021-06-18 NOTE — Anesthesia Preprocedure Evaluation (Addendum)
Anesthesia Evaluation  Patient identified by MRN, date of birth, ID band Patient awake    Reviewed: Allergy & Precautions, NPO status , Patient's Chart, lab work & pertinent test results  Airway Mallampati: III  TM Distance: >3 FB Neck ROM: Full    Dental  (+) Poor Dentition, Dental Advisory Given, Missing, Chipped,    Pulmonary neg pulmonary ROS, former smoker,    Pulmonary exam normal breath sounds clear to auscultation       Cardiovascular + CAD and + CABG  Normal cardiovascular exam Rhythm:Regular Rate:Normal     Neuro/Psych negative neurological ROS  negative psych ROS   GI/Hepatic negative GI ROS, Neg liver ROS,   Endo/Other  diabetes, Type 2, Insulin Dependent  Renal/GU Renal InsufficiencyRenal disease  negative genitourinary   Musculoskeletal negative musculoskeletal ROS (+)   Abdominal   Peds  Hematology  (+) Blood dyscrasia (HGB 10.8), anemia ,   Anesthesia Other Findings   Reproductive/Obstetrics                            Anesthesia Physical Anesthesia Plan  ASA: 3  Anesthesia Plan: General and Regional   Post-op Pain Management:  Regional for Post-op pain   Induction: Intravenous  PONV Risk Score and Plan: 2 and Ondansetron, Dexamethasone and Treatment may vary due to age or medical condition  Airway Management Planned: LMA  Additional Equipment:   Intra-op Plan:   Post-operative Plan: Extubation in OR  Informed Consent: I have reviewed the patients History and Physical, chart, labs and discussed the procedure including the risks, benefits and alternatives for the proposed anesthesia with the patient or authorized representative who has indicated his/her understanding and acceptance.     Dental advisory given  Plan Discussed with: CRNA  Anesthesia Plan Comments:         Anesthesia Quick Evaluation

## 2021-06-18 NOTE — Progress Notes (Signed)
Subjective:  [name] was evaluated and examined at the bedside today. He is thankful to be out of surgery and states he is not in any pain. He says he knows he has a long road ahead of him, but he is thankful to have his care team by his side.  Objective:  Vital signs in last 24 hours: Vitals:   06/17/21 1556 06/17/21 1950 06/17/21 2352 06/18/21 0416  BP: (!) 154/67 (!) 125/54 (!) 103/37 (!) 135/56  Pulse: 64 60 60 60  Resp: 17 17 17 17   Temp: 98.5 F (36.9 C) 98.1 F (36.7 C) 98.4 F (36.9 C) 97.7 F (36.5 C)  TempSrc: Oral Oral Oral Oral  SpO2: 96% 98% 96% 95%  Weight:    94.3 kg  Height:       Weight change: 2.3 kg  Intake/Output Summary (Last 24 hours) at 06/18/2021 0739 Last data filed at 06/18/2021 08/18/2021 Gross per 24 hour  Intake 1816.82 ml  Output 650 ml  Net 1166.82 ml   Physical Exam: Constitutional: well-appearing and laying in bed, in no acute distress Pulmonary/Chest: nasal canula in place, normal work of breathing Cardiovascular: irregular rhythm, 2+ radial pulses bilaterally Skin: nail beds are pale, normal capillary refill Extremities: left lower extremity now with BKA, residual limb with wrap in place  Pertinent Labs: CBC    Component Value Date/Time   WBC 4.5 06/18/2021 0138   RBC 3.67 (L) 06/18/2021 0138   HGB 10.8 (L) 06/18/2021 0138   HCT 32.7 (L) 06/18/2021 0138   PLT 182 06/18/2021 0138   MCV 89.1 06/18/2021 0138   MCH 29.4 06/18/2021 0138   MCHC 33.0 06/18/2021 0138   RDW 12.9 06/18/2021 0138   LYMPHSABS 1.3 06/11/2021 1732   MONOABS 0.5 06/11/2021 1732   EOSABS 0.2 06/11/2021 1732   BASOSABS 0.0 06/11/2021 1732   BMP Latest Ref Rng & Units 06/18/2021 06/17/2021 06/16/2021  Glucose 70 - 99 mg/dL 08/16/2021) 505(L) 976(B)  BUN 8 - 23 mg/dL 341(P) 37(T) 02(I)  Creatinine 0.61 - 1.24 mg/dL 09(B) 3.53(G) 9.92(E)  Sodium 135 - 145 mmol/L 138 137 136  Potassium 3.5 - 5.1 mmol/L 3.5 3.6 3.6  Chloride 98 - 111 mmol/L 109 107 106  CO2 22 - 32  mmol/L 19(L) 19(L) 20(L)  Calcium 8.9 - 10.3 mg/dL 8.2(L) 8.3(L) 8.4(L)   CBG (last 3)  Recent Labs    06/17/21 2008 06/17/21 2350 06/18/21 0414  GLUCAP 126* 117* 111*   Assessment/Plan:  Principal Problem:   Left diabetic foot wound with third digit osteomyelitis   Active Problems:   Limb Threatening Ischemia of the Left Lower Extremity 2/2 Severe PAD   Bilateral pressure wounds on heels   Acute Kidney Injury   Iron Deficiency Anemia   Paroxysmal A. Fib   CAD s/p CABG   CHF   HTN   HLD   DM  Patient Summary: Cameron Ruiz is a 87 year-old man with DM, CAD s/p CABG, HTN, HLD, and blindness who presented with painful wounds on the left foot and was admitted for management of a left diabetic foot wound with cellulitis/third digit osteomyelitis complicated by limb threatening ischemia secondary to severe PAD.  Left Diabetic foot wound with third digit osteomyelitis Patient is on day 8 of antibiotic treatment and is currently receiving IV vancomycin, cefepime for pseudomonal coverage, and metronidazole for anaerobic coverage. He still has multiple gangrenous toes and his potential for healing is complicated by limb threatening ischemia found via arteriogram. Vascular  surgery performed BKA today. Will appreciate their recommendations.  -Heparin to be resumed today -IV vancomycin, cefepime, and Flagyl until 48 hrs postop -Continue pain management -acetaminophen 1000 mg TID -oxycodone-acetaminophen 5-325 q6 hrs prn for moderate pain -tramadol 50 mg q6 hrs prn for moderate pain -dilaudid 0.5 mg q4 hrs prn for severe pain -Continue prevalon for heel protection and pain relief -TOC consulted for SNF placement  Limb Threatening Ischemia of the LLE Arteriogram revealed multiple occlusions in the left lower extremity (superficial femoral, poplitea) and patient does not appear to have good options for endovascular surgery to revascularize the limb. Vascular surgery is following and  plans for left BKA. -See above  Acute Kidney Injury Cr has increased from 1.32 to 1.86 over the past 48 hours. BUN/Cr >20. Possible pre-renal AKI 2/2 dehydration. -Continue IV fluids -Hold losartan 100 mg daily  Paroxysmal A. Fib On Eliquis at home. Placed on heparin gtt in preparation for procedures/surgeries. Irregular rhythm on exam today with PVCs on telemetry. EKG ordered. -continue holding home Eliquis -heparin to be resumed today -follow up EKG  Bilateral Pressure Wounds on Heels Patient with boggy heels bilaterally on physical exam evident of deep tissue injury. Skin integrity remains in tact on both heels. -Continue prevalon boots on right heel postop  Iron Deficiency Anemia Patient with a Hgb of 10.8. Reticulocyte count 1.2%. Ferritin 25. Patient is hypoproliferative and iron deficient. IV iron not indicated at this time as patient has active infection. Patient will likely benefit from iron supplementation as an outpatient. -Trend CBC -Transfuse if Hgb < 7.0  CAD s/p CABG CHF Patient had open heart surgery >20 years ago. -Continue home ASA 81 mg, nitroglycerin 0.4 mg prn, simvastatin 20 mg daily, and torsemide 10 mg daily  HTN HLD -Continue home hydralazine 100 mg TID, losartan 100 mg daily, Coreg 6.25 mg BID -Continue home simvastatin 20 mg daily  DM Patient on NovoLog 70/30 at home, takes 20 units in the morning and 20 units at night. Blood glucose levels have been within tolerable limits since admission. -Continue SSI   LOS: 6 days   Val Eagle, Medical Student 06/18/2021, 7:39 AM

## 2021-06-19 ENCOUNTER — Encounter (HOSPITAL_COMMUNITY): Payer: Self-pay | Admitting: Vascular Surgery

## 2021-06-19 DIAGNOSIS — L089 Local infection of the skin and subcutaneous tissue, unspecified: Secondary | ICD-10-CM | POA: Diagnosis not present

## 2021-06-19 DIAGNOSIS — I96 Gangrene, not elsewhere classified: Secondary | ICD-10-CM | POA: Diagnosis present

## 2021-06-19 DIAGNOSIS — I4891 Unspecified atrial fibrillation: Secondary | ICD-10-CM | POA: Diagnosis present

## 2021-06-19 DIAGNOSIS — E11628 Type 2 diabetes mellitus with other skin complications: Secondary | ICD-10-CM | POA: Diagnosis not present

## 2021-06-19 DIAGNOSIS — N179 Acute kidney failure, unspecified: Secondary | ICD-10-CM | POA: Diagnosis present

## 2021-06-19 DIAGNOSIS — E119 Type 2 diabetes mellitus without complications: Secondary | ICD-10-CM

## 2021-06-19 DIAGNOSIS — N1831 Chronic kidney disease, stage 3a: Secondary | ICD-10-CM | POA: Diagnosis present

## 2021-06-19 LAB — CBC
HCT: 30.7 % — ABNORMAL LOW (ref 39.0–52.0)
Hemoglobin: 10.1 g/dL — ABNORMAL LOW (ref 13.0–17.0)
MCH: 29.3 pg (ref 26.0–34.0)
MCHC: 32.9 g/dL (ref 30.0–36.0)
MCV: 89 fL (ref 80.0–100.0)
Platelets: 187 10*3/uL (ref 150–400)
RBC: 3.45 MIL/uL — ABNORMAL LOW (ref 4.22–5.81)
RDW: 13.2 % (ref 11.5–15.5)
WBC: 5.9 10*3/uL (ref 4.0–10.5)
nRBC: 0 % (ref 0.0–0.2)

## 2021-06-19 LAB — GLUCOSE, CAPILLARY
Glucose-Capillary: 134 mg/dL — ABNORMAL HIGH (ref 70–99)
Glucose-Capillary: 127 mg/dL — ABNORMAL HIGH (ref 70–99)
Glucose-Capillary: 126 mg/dL — ABNORMAL HIGH (ref 70–99)
Glucose-Capillary: 170 mg/dL — ABNORMAL HIGH (ref 70–99)
Glucose-Capillary: 166 mg/dL — ABNORMAL HIGH (ref 70–99)

## 2021-06-19 LAB — HEPARIN LEVEL (UNFRACTIONATED): Heparin Unfractionated: 0.17 IU/mL — ABNORMAL LOW (ref 0.30–0.70)

## 2021-06-19 MED ORDER — MORPHINE SULFATE (PF) 4 MG/ML IV SOLN
4.0000 mg | INTRAVENOUS | Status: DC | PRN
  Administered 2021-06-19: 4 mg via INTRAVENOUS
  Filled 2021-06-19: qty 1

## 2021-06-19 MED ORDER — MORPHINE SULFATE (PF) 2 MG/ML IV SOLN: 2.0000 mg | INTRAVENOUS | Status: DC | PRN

## 2021-06-19 MED ORDER — GABAPENTIN 100 MG PO CAPS
100.0000 mg | ORAL_CAPSULE | Freq: Three times a day (TID) | ORAL | Status: DC
  Administered 2021-06-19 – 2021-06-24 (×16): 100 mg via ORAL
  Filled 2021-06-19 (×16): qty 1

## 2021-06-19 MED ORDER — OXYCODONE HCL 5 MG PO TABS
5.0000 mg | ORAL_TABLET | ORAL | Status: DC | PRN
  Administered 2021-06-19 (×2): 10 mg via ORAL
  Administered 2021-06-19: 5 mg via ORAL
  Administered 2021-06-20 – 2021-06-21 (×6): 10 mg via ORAL
  Filled 2021-06-19 (×4): qty 2
  Filled 2021-06-19: qty 1
  Filled 2021-06-19 (×4): qty 2

## 2021-06-19 MED ORDER — ACETAMINOPHEN 325 MG PO TABS
650.0000 mg | ORAL_TABLET | Freq: Four times a day (QID) | ORAL | Status: DC
  Administered 2021-06-19 – 2021-06-24 (×21): 650 mg via ORAL
  Filled 2021-06-19 (×21): qty 2

## 2021-06-19 NOTE — Progress Notes (Signed)
   Subjective:  Patient evaluated at bedside this AM. Reports he is doing well, pain well-controlled. Will work with physical therapy.  Objective:  Vital signs in last 24 hours: Vitals:   06/19/21 0332 06/19/21 0334 06/19/21 0800 06/19/21 0849  BP: (!) 144/51  (!) 140/38 (!) 146/56  Pulse: 62  60 (!) 58  Resp: 20  17 15   Temp: 97.6 F (36.4 C)  97.9 F (36.6 C)   TempSrc: Oral  Oral   SpO2: 97%  95% 94%  Weight:  92.8 kg    Height:       General: Resting comfortably in bed in no acute distress CV: Regular rate, rhythm. No murmurs, rubs, gallops Pulm: Normal work of breathing on room air. Clear to ausculation bilaterally MSK: Normal bulk, tone. L BKA. Skin: Staples on left stump in tact. No surrounding erythema or drainage present. Neuro: Awake, alert, conversing appropriately. Psych: Normal mood, affect, speech.  Assessment/Plan:  Cameron Ruiz is 87yo person with type 2 diabetes, coronary artery disease s/p CABG, hypertension, hyperlipidemia, paroxysmal atrial fibrillation on DOAC admitted 7/31 for diabetic foot infection complicated by peripheral arterial disease, now POD2 L BKA with pain controlled and plans for SNF placement.  Active Problems:   Cellulitis   Cellulitis of foot, left   Diabetic foot infection (HCC)   Pressure injury of skin   Malnutrition of moderate degree  #L diabetic foot infection complicated by PVD, now POD2 L BKA Patient has been doing well since surgical intervention two days ago. Antibiotics have been discontinued as source has been controlled. No other signs of infection thus far. Appreciate vascular recommendations. Will continue to work on pain management as needed. Physical therapy to work with patient today, recommendations for SNF. Will work with case management for placement. - D/c heparin today - Pain management: Acetaminophen 650mg  q6h, oxycodone 5-10mg  q4h PRN - Dressings per surgical team - PT/OT  #Paroxysmal atrial  fibrillation Tele reviewed, no atrial fibrillation over the last 24 hours. Patient has been on heparin due to recent surgery. Will plan to switch to home Eliquis today. - Carvedilol 6.25mg  twice daily - Eliquis 2.5mg  twice daily  #Type 2 diabetes mellitus Most recent A1c 5.9%. Sugars have been at goal to promote wound healing. Will continue sliding scale. - SSI  #AKI vs CKD No previous history of chronic kidney disease, no baseline. Will re-check BMP today for further assessment. - F/u BMP today - Strict I/O  #Coronary artery disease s/p CABG - Continue home aspirin, simvastatin  Prior to Admission Living Arrangement: Home Anticipated Discharge Location: SNF per PT/OT Barriers to Discharge: SNF placement Dispo: Anticipated discharge in approximately 2-3 day(s).   8/31, MD 06/20/2021, 11:57 AM Pager: [phone] After 5pm on weekdays and 1pm on weekends: On Call pager [phone]

## 2021-06-19 NOTE — Plan of Care (Signed)
  Problem: Clinical Measurements: Goal: Will remain free from infection Outcome: Progressing   Problem: Health Behavior/Discharge Planning: Goal: Ability to manage health-related needs will improve Outcome: Not Progressing   

## 2021-06-19 NOTE — Progress Notes (Addendum)
  Progress Note    06/19/2021 8:49 AM 1 Day Post-Op  Subjective:  pain in stump overnight   Vitals:   06/19/21 0800 06/19/21 0849  BP: (!) 140/38 (!) 146/56  Pulse: 60 (!) 58  Resp: 17 15  Temp: 97.9 F (36.6 C)   SpO2: 95% 94%    Physical Exam: Incisions:  L BKA dressing left in place   CBC    Component Value Date/Time   WBC 5.9 06/19/2021 0114   RBC 3.45 (L) 06/19/2021 0114   HGB 10.1 (L) 06/19/2021 0114   HCT 30.7 (L) 06/19/2021 0114   PLT 187 06/19/2021 0114   MCV 89.0 06/19/2021 0114   MCH 29.3 06/19/2021 0114   MCHC 32.9 06/19/2021 0114   RDW 13.2 06/19/2021 0114   LYMPHSABS 1.3 06/11/2021 1732   MONOABS 0.5 06/11/2021 1732   EOSABS 0.2 06/11/2021 1732   BASOSABS 0.0 06/11/2021 1732    BMET    Component Value Date/Time   NA 138 06/18/2021 0138   K 3.5 06/18/2021 0138   CL 109 06/18/2021 0138   CO2 19 (L) 06/18/2021 0138   GLUCOSE 118 (H) 06/18/2021 0138   BUN 38 (H) 06/18/2021 0138   CREATININE 1.86 (H) 06/18/2021 0138   CALCIUM 8.2 (L) 06/18/2021 0138   GFRNONAA 34 (L) 06/18/2021 0138    INR    Component Value Date/Time   INR 1.2 06/12/2021 2016     Intake/Output Summary (Last 24 hours) at 06/19/2021 0849 Last data filed at 06/19/2021 0052 Gross per 24 hour  Intake 1728.02 ml  Output 800 ml  Net 928.02 ml     Assessment/Plan:  87 y.o. male is s/p left below knee amputation  1 Day Post-Op  - Pain in stump overnight; pain medication regimen adjusted, gabapentin added - dressing change by vascular team tomorrow - PT/OT eval today - Continue low rate IV heparin; resume Eliquis tomorrow if no bleeding or stump hematoma - Ok to d/c IV antibiotics 48 hours after amputation   Emilie Rutter, PA-C [facility] [phone] 06/19/2021 8:49 AM  VASCULAR STAFF ADDENDUM: I have independently interviewed and examined the patient. I agree with the above.  Added multimodal therapy for pain control PT/OT/OOB Eliquis  tomorrow.  Rande Brunt. Lenell Antu, MD [facility] Phone Number: [phone] 06/19/2021 9:42 AM

## 2021-06-19 NOTE — Progress Notes (Signed)
ANTICOAGULATION CONSULT NOTE - Follow Up Consult  Pharmacy Consult for heparin Indication: atrial fibrillation  Allergies  Allergen Reactions   Carbocaine [Mepivacaine]    Cogentin [Benztropine]    Fluogen [Influenza Virus Vaccine]     Flu & pneumonia vaccine   Keflex [Cephalexin]    Kenalog [Triamcinolone]    Levaquin [Levofloxacin]     Patient Measurements: Height: 6\' 1"  (185.4 cm) (stated) Weight: 92.8 kg (204 lb 9.4 oz) IBW/kg (Calculated) : 79.9 Heparin Dosing Weight: 99.8  Vital Signs: Temp: 97.6 F (36.4 C) (08/06 0332) Temp Source: Oral (08/06 0332) BP: 144/51 (08/06 0332) Pulse Rate: 62 (08/06 0332)  Labs: Recent Labs    06/17/21 0659 06/17/21 1044 06/17/21 2249 06/18/21 0138 06/19/21 0114  HGB 10.9*  --   --  10.8* 10.1*  HCT 33.2*  --   --  32.7* 30.7*  PLT 181  --   --  182 187  APTT >200* >200*  --   --   --   HEPARINUNFRC >1.10* >1.10* >1.10* 0.72* 0.17*  CREATININE 1.63*  --   --  1.86*  --     Estimated Creatinine Clearance: 31 mL/min (A) (by C-G formula based on SCr of 1.86 mg/dL (H)).   Assessment:  87 y.o. male with h/o Afib, Eliquis on hold, on heparin   S/p L BKA 8/5. Talked with vasular PA on 8/5, to reduce risk of hematoma post-procedure will order heparin 500 units/hr fixed for now.   8/6 Heparin level 0.17 - within goal to avoid postop bleeding. Hgb 10.1, plt 187. No s/sx of bleeding.  Goal of Therapy:  Heparin level 0.3-0.7 units/ml Monitor platelets by anticoagulation protocol: Yes   Plan:  Continue heparin 500 units/hr Monitor daily heparin level, CBC, and for s/sx of bleeding  Plan apixaban restart on 8/7 if no hematoma/bleeding  Thank you for allowing pharmacy to be a part of this patient's care.  10/7, PharmD PGY1 Acute Care Pharmacy Resident  Phone: [phone] 06/19/2021  7:52 AM  Please check [email or web address] for unit-specific pharmacy phone numbers.

## 2021-06-19 NOTE — Progress Notes (Signed)
Mobility Specialist: Progress Note   06/19/21 1504  Mobility  Activity Dangled on edge of bed  Level of Assistance Minimal assist, patient does 75% or more  Assistive Device None  Mobility Sit up in bed/chair position for meals  Mobility Response Tolerated well  Mobility performed by Mobility specialist  $Mobility charge 1 Mobility   Pre-Mobility: 54 HR, 96% SpO2 Post-Mobility: 58 HR, 142/53 BP, 98% SpO2  Pt required minA to sit EOB from supine. Pt c/o intermittent pain in L stump during transfer to/from EOB but otherwise asx. Pt had no c/o pain when sitting EOB and performing LE exercises. Pt back in bed with call bell and phone at his side.   [facility] Toiya Morrish Mobility Specialist Mobility Specialist Phone: [phone]

## 2021-06-19 NOTE — Progress Notes (Signed)
Does this patient still need to be q 4 CBGs?

## 2021-06-19 NOTE — Progress Notes (Addendum)
Subjective: I seen and evaluated Cameron Ruiz at bedside.  He states that he is feeling fine.  He reports some discomfort in his left residual limb.  Pain medication was adjusted yesterday per vascular surgery.  He denies headaches/chest pain/SOB/abdominal discomfort.  He reports feeling thankful that the surgery was successful.  And is hopeful about the long road ahead of him.  He is very appreciative of the care team.  Objective:  Vital signs in last 24 hours: Vitals:   06/19/21 0332 06/19/21 0334 06/19/21 0800 06/19/21 0849  BP: (!) 144/51  (!) 140/38 (!) 146/56  Pulse: 62  60 (!) 58  Resp: 20  17 15   Temp: 97.6 F (36.4 C)  97.9 F (36.6 C)   TempSrc: Oral  Oral   SpO2: 97%  95% 94%  Weight:  92.8 kg    Height:       Physical Exam HENT:     Head: Normocephalic and atraumatic.  Cardiovascular:     Rate and Rhythm: Normal rate.  Pulmonary:     Effort: Pulmonary effort is normal.     Breath sounds: Normal breath sounds.  Abdominal:     Palpations: Abdomen is soft.  Musculoskeletal:     Right lower leg: No tenderness. No edema.     Comments: Left residual limb wrapped in bandage.     Left Lower Extremity: Left leg is amputated below knee.  Skin:    General: Skin is warm and dry.  Neurological:     General: No focal deficit present.     Mental Status: He is alert.  Psychiatric:        Mood and Affect: Mood normal.        Behavior: Behavior normal. Behavior is cooperative.     Assessment/Plan:  Principal Problem:   Diabetic foot infection (HCC) Active Problems:   Pressure injury of skin   Malnutrition of moderate degree   Gangrene (HCC)   Acute renal failure superimposed on stage 3a chronic kidney disease (HCC)   Atrial fibrillation (HCC)   Diabetes (HCC)  Left diabetic foot infection with third digit osteomyelitis, gangrene, and peripheral artery disease Patient is on day 9 of antibiotic treatment and is currently receiving IV vancomycin, cefepime for  pseudomonal coverage, and metronidazole for anaerobic coverage. He had multiple gangrenous toes, complicated by limb threatening ischemia found via arteriogram. Vascular surgery performed BKA 8/4.  -Heparin to be discontinued and switched to Eliquis tomorrow morning per vascular surgery. -IV vancomycin, cefepime, and Flagyl until 48 hrs postop -Continue pain management as adjusted by vascular surgery -acetaminophen 650 mg Q6H -Roxicodone 5-10 mg Q4 PRN -Morphine IV 2-4mg  Q2H PRN -dilaudid 0.5-1 mg Q2H for severe pain -Continue prevalon for heel protection and pain relief -TOC consulted for SNF placement -PT evaul/treatment ordered   Acute Kidney Injury Cr has increased from 1.32 to 1.86 over the past 48 hours. BUN/Cr >20. Possible pre-renal AKI 2/2 dehydration. -Continue IV fluids -Hold losartan 100 mg daily -Repeat BMP in the morning   Paroxysmal A. Fib -Currently on Heparin post-op, BKA.  -continue holding Eliquis; will start Eliquis and dc heparin tomorrow morning per vascular surgery   Bilateral Pressure Wounds on Heels Patient with boggy heels bilaterally on physical exam evident of deep tissue injury. Skin integrity remains in tact on both heels. -Continue prevalon boots on right heel postop   Iron Deficiency Anemia Patient with a Hgb of 10.1. Reticulocyte count 1.2%. Ferritin 25. Patient is hypoproliferative and iron deficient.  Patient  will likely benefit from iron supplementation as an outpatient. -Checking Iron panel   CAD s/p CABG CHF Patient had open heart surgery >20 years ago. -Continue home ASA 81 mg, nitroglycerin 0.4 mg prn, simvastatin 20 mg daily, and torsemide 10 mg daily   HTN HLD -Continue home hydralazine 100 mg TID, Coreg 6.25 mg BID -Continue home simvastatin 20 mg daily  -Losartan held due to elevated creatinine level at 1.86. Will resume when kidney function improves - Repeat BMP in the morning  DM Patient on NovoLog 70/30 at home, takes 20 units  in the morning and 20 units at night. A1c is 5.9%. Insulin has been on hold, blood sugars have been fine. Goal maintain glucose under 180 to promote wound healing. Currently requiring very little sliding scale insulin. May change as he starts to eat more, or he may not need to have longterm insulin at discharge.   Anticipated Discharge Location: SNF Barriers to Discharge: Dispo: Anticipated discharge in approximately 1-2 day(s).   Cameron Filbert, MD 06/19/2021, 12:21 PM Pager: [phone] After 5pm on weekdays and 1pm on weekends: On Call pager [phone]

## 2021-06-20 LAB — BASIC METABOLIC PANEL
Anion gap: 8 (ref 5–15)
BUN: 44 mg/dL — ABNORMAL HIGH (ref 8–23)
CO2: 18 mmol/L — ABNORMAL LOW (ref 22–32)
Calcium: 8.4 mg/dL — ABNORMAL LOW (ref 8.9–10.3)
Chloride: 117 mmol/L — ABNORMAL HIGH (ref 98–111)
Creatinine, Ser: 1.87 mg/dL — ABNORMAL HIGH (ref 0.61–1.24)
GFR, Estimated: 34 mL/min — ABNORMAL LOW (ref >60)
Glucose, Bld: 179 mg/dL — ABNORMAL HIGH (ref 70–99)
Potassium: 3.6 mmol/L (ref 3.5–5.1)
Sodium: 143 mmol/L (ref 135–145)

## 2021-06-20 LAB — GLUCOSE, CAPILLARY
Glucose-Capillary: 143 mg/dL — ABNORMAL HIGH (ref 70–99)
Glucose-Capillary: 165 mg/dL — ABNORMAL HIGH (ref 70–99)
Glucose-Capillary: 253 mg/dL — ABNORMAL HIGH (ref 70–99)
Glucose-Capillary: 213 mg/dL — ABNORMAL HIGH (ref 70–99)

## 2021-06-20 LAB — HEPARIN LEVEL (UNFRACTIONATED): Heparin Unfractionated: 0.22 IU/mL — ABNORMAL LOW (ref 0.30–0.70)

## 2021-06-20 LAB — CBC
HCT: 29 % — ABNORMAL LOW (ref 39.0–52.0)
Hemoglobin: 9.4 g/dL — ABNORMAL LOW (ref 13.0–17.0)
MCH: 28.6 pg (ref 26.0–34.0)
MCHC: 32.4 g/dL (ref 30.0–36.0)
MCV: 88.1 fL (ref 80.0–100.0)
Platelets: 170 10*3/uL (ref 150–400)
RBC: 3.29 MIL/uL — ABNORMAL LOW (ref 4.22–5.81)
RDW: 13.3 % (ref 11.5–15.5)
WBC: 6.3 10*3/uL (ref 4.0–10.5)
nRBC: 0 % (ref 0.0–0.2)

## 2021-06-20 MED ORDER — APIXABAN 2.5 MG PO TABS
2.5000 mg | ORAL_TABLET | Freq: Two times a day (BID) | ORAL | Status: DC
  Administered 2021-06-20 – 2021-06-24 (×9): 2.5 mg via ORAL
  Filled 2021-06-20 (×9): qty 1

## 2021-06-20 MED ORDER — ASPIRIN 81 MG PO CHEW
81.0000 mg | CHEWABLE_TABLET | Freq: Every day | ORAL | Status: DC
  Administered 2021-06-20 – 2021-06-24 (×5): 81 mg via ORAL
  Filled 2021-06-20 (×5): qty 1

## 2021-06-20 MED ORDER — LACTATED RINGERS IV BOLUS: 1000.0000 mL | Freq: Once | INTRAVENOUS | Status: DC

## 2021-06-20 MED ORDER — LACTATED RINGERS IV BOLUS
500.0000 mL | Freq: Once | INTRAVENOUS | Status: AC
  Administered 2021-06-20: 500 mL via INTRAVENOUS

## 2021-06-20 NOTE — Plan of Care (Signed)
°  Problem: Clinical Measurements: °Goal: Will remain free from infection °Outcome: Progressing °Goal: Respiratory complications will improve °Outcome: Progressing °  °

## 2021-06-20 NOTE — Progress Notes (Addendum)
  Progress Note    06/20/2021 8:28 AM 2 Days Post-Op  Subjective:  No new complaints   Vitals:   06/19/21 2323 06/20/21 0402  BP: (!) 138/51 (!) 158/65  Pulse: 70 75  Resp: 18 19  Temp: 97.8 F (36.6 C) 97.8 F (36.6 C)  SpO2: 98% 97%    Physical Exam: Incisions:  L BKA incision c/d/I without bleeding or hematoma   CBC    Component Value Date/Time   WBC 6.3 06/20/2021 0050   RBC 3.29 (L) 06/20/2021 0050   HGB 9.4 (L) 06/20/2021 0050   HCT 29.0 (L) 06/20/2021 0050   PLT 170 06/20/2021 0050   MCV 88.1 06/20/2021 0050   MCH 28.6 06/20/2021 0050   MCHC 32.4 06/20/2021 0050   RDW 13.3 06/20/2021 0050   LYMPHSABS 1.3 06/11/2021 1732   MONOABS 0.5 06/11/2021 1732   EOSABS 0.2 06/11/2021 1732   BASOSABS 0.0 06/11/2021 1732    BMET    Component Value Date/Time   NA 138 06/18/2021 0138   K 3.5 06/18/2021 0138   CL 109 06/18/2021 0138   CO2 19 (L) 06/18/2021 0138   GLUCOSE 118 (H) 06/18/2021 0138   BUN 38 (H) 06/18/2021 0138   CREATININE 1.86 (H) 06/18/2021 0138   CALCIUM 8.2 (L) 06/18/2021 0138   GFRNONAA 34 (L) 06/18/2021 0138    INR    Component Value Date/Time   INR 1.2 06/12/2021 2016     Intake/Output Summary (Last 24 hours) at 06/20/2021 7353 Last data filed at 06/20/2021 0404 Gross per 24 hour  Intake 899.63 ml  Output 1450 ml  Net -550.37 ml     Assessment/Plan:  87 y.o. male is s/p left below knee amputation  2 Days Post-Op  - dry dressing change today, continue daily and prn; no bleeding or hematoma noted - Patient needs to mobilize; hopefully PT/OT will work with him since treatment was cancelled on 8/4 - Ok to stop IV antibiotics - Ok to transition from heparin to Eliquis - dispo: pending therapy evals    Emilie Rutter, PA-C [facility] [phone] 06/20/2021 8:28 AM  VASCULAR STAFF ADDENDUM: I have independently interviewed and examined the patient. I agree with the above.   Rande Brunt. Lenell Antu, MD [facility] Phone Number: [phone] 06/20/2021 10:04 AM

## 2021-06-20 NOTE — Progress Notes (Signed)
Mobility Specialist: Progress Note   06/20/21 1301  Mobility  Activity Transferred:  Chair to bed  Level of Assistance Moderate assist, patient does 50-74%  Assistive Device Sliding board  Mobility Out of bed to chair with meals  Mobility Response Tolerated fair  Mobility performed by Mobility specialist  $Mobility charge 1 Mobility   Post-Mobility: 60 HR, 128/70 BP, 95% SpO2  Pt assisted back to bed per request using slidng board. Pt required verbal cues for hand placement as well as physical assistance to scoot using pad. Pt ended up sliding too far forward and had NT assist me in getting back to the bed. Pt is back in the bed with call bell and phone at his side. Bed alarm is on.   [facility] Bessie Boyte Mobility Specialist Mobility Specialist Phone: [phone]

## 2021-06-20 NOTE — Evaluation (Signed)
Physical Therapy Evaluation Patient Details Name: Cameron Ruiz MRN: [id number] DOB: 08/23/1931 Today's Date: 06/20/2021   History of Present Illness  Cameron Ruiz is a 87 y.o. male admitted on 06/11/2021 with osteomyelitis of left foot wound. 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 PMH includes diabetes on insulin, cardiovascular disease s/p CABG, HTN, and HLD.  Clinical Impression   Patient received in bed, pleasantly confused and cooperative. Did require much heavier levels of physical assist as compared to pre-amputation mobility with therapy- needed as much as MaxAx2 to pivot over to the recliner. Had a very hard time hopping with RW, and needed assist with RW management; ultimately needed recliner brought up behind him due to poor proprioception, gross weakness, and limited command following- kept scooting his R foot forward instead of keeping it under him, which increased existing posterior LOB. Left up in recliner with all needs met, chair alarm active. Recommend SNF and 24/7A at DC- will continue to follow.     Follow Up Recommendations SNF;Supervision/Assistance - 24 hour    Equipment Recommendations  Rolling walker with 5" wheels;3in1 (PT);Wheelchair (measurements PT);Wheelchair cushion (measurements PT) (drop arm BSC and wheel chair, elevating leg rests on WC)    Recommendations for Other Services       Precautions / Restrictions Precautions Precautions: Fall;Other (comment) Precaution Comments: poor vision, new L BKA Restrictions Weight Bearing Restrictions: Yes LLE Weight Bearing: Non weight bearing      Mobility  Bed Mobility Overal bed mobility: Needs Assistance Bed Mobility: Supine to Sit     Supine to sit: Mod assist;HOB elevated     General bed mobility comments: ModA and Mod-Max VC to get to EOB; heavy posterior lean and needed assist at trunk for support until he was able to scoot and get his feet on the floor    Transfers Overall transfer level: Needs  assistance Equipment used: Rolling walker (2 wheeled) Transfers: Sit to/from [facility] Sit to Stand: Mod assist;+2 physical assistance Stand pivot transfers: Max assist;+2 physical assistance       General transfer comment: ModAx2 to boost to standing, had somewhat of a posterior lean but able to balance with RW/external assist; did need MaxAx2 as well as chair brought up behind him to prevent a fall due to gross weakness, impaired proprioception, and limited command following  Ambulation/Gait             General Gait Details: unable  Stairs            Wheelchair Mobility    Modified Rankin (Stroke Patients Only)       Balance Overall balance assessment: Needs assistance Sitting-balance support: No upper extremity supported;Feet unsupported Sitting balance-Leahy Scale: Fair Sitting balance - Comments: close min guard for safety once feet on floor   Standing balance support: During functional activity;Bilateral upper extremity supported Standing balance-Leahy Scale: Zero Standing balance comment: walker and MaxAx2 for balance with limited attempts to correct/limited awareness                             Pertinent Vitals/Pain Pain Assessment: Faces Faces Pain Scale: Hurts little more Pain Location: L BKA incision site Pain Descriptors / Indicators: Sore;Discomfort Pain Intervention(s): Limited activity within patient's tolerance;Monitored during session    Home Living Family/patient expects to be discharged to:: Private residence Living Arrangements: Spouse/significant other (son/grandchild live nearby and can assist as needed) Available Help at Discharge: Family;Available 24 hours/day Type of Home: House Home Access:  Stairs to enter   [employer] of Steps: 3 Home Layout: One level Home Equipment: Walker - 2 wheels Additional Comments: wife recieves home heath therapies    Prior Function Level of Independence:  Independent with assistive device(s)         Comments: pt uses a standard walker for ambulation, he sponge bathes at the sink     Hand Dominance   Dominant Hand: Right    Extremity/Trunk Assessment   Upper Extremity Assessment Upper Extremity Assessment: Defer to OT evaluation    Lower Extremity Assessment Lower Extremity Assessment: Generalized weakness    Cervical / Trunk Assessment Cervical / Trunk Assessment: Kyphotic  Communication   Communication: No difficulties  Cognition Arousal/Alertness: Awake/alert;Suspect due to medications Behavior During Therapy: Flat affect Overall Cognitive Status: Impaired/Different from baseline Area of Impairment: Attention;Following commands;Safety/judgement;Problem solving;Awareness                   Current Attention Level: Sustained   Following Commands: Follows one step commands inconsistently;Follows one step commands with increased time Safety/Judgement: Decreased awareness of safety;Decreased awareness of deficits Awareness: Intellectual Problem Solving: Slow processing;Decreased initiation;Difficulty sequencing;Requires verbal cues;Requires tactile cues General Comments: very poor insight into deficits and able to efficiently follow cues perhaps 60% of the time AT BEST; often makes bizarre statements like asking if he can feel his own foot touching the floor. Received pain meds prior to therapy, so unsure what is true cognition and what is being driven by pain meds      General Comments      Exercises     Assessment/Plan    PT Assessment Patient needs continued PT services  PT Problem List Decreased strength;Decreased balance;Decreased mobility;Decreased activity tolerance;Decreased coordination;Decreased safety awareness;Decreased knowledge of use of DME;Decreased cognition       PT Treatment Interventions DME instruction;Functional mobility training;Therapeutic activities;Gait training;Stair  training;Therapeutic exercise;Balance training;Patient/family education    PT Goals (Current goals can be found in the Care Plan section)  Acute Rehab PT Goals Patient Stated Goal: Not stated, PT Goal Formulation: With patient Time For Goal Achievement: 07/04/21 Potential to Achieve Goals: Fair    Frequency Min 3X/week   Barriers to discharge Inaccessible home environment;Decreased caregiver support      Co-evaluation               AM-PAC PT "6 Clicks" Mobility  Outcome Measure Help needed turning from your back to your side while in a flat bed without using bedrails?: A Little Help needed moving from lying on your back to sitting on the side of a flat bed without using bedrails?: A Lot Help needed moving to and from a bed to a chair (including a wheelchair)?: Total Help needed standing up from a chair using your arms (e.g., wheelchair or bedside chair)?: Total Help needed to walk in hospital room?: Total Help needed climbing 3-5 steps with a railing? : Total 6 Click Score: 9    End of Session Equipment Utilized During Treatment: Gait belt Activity Tolerance: Patient tolerated treatment well Patient left: in chair;with call bell/phone within reach;with chair alarm set Nurse Communication: Mobility status;Need for lift equipment PT Visit Diagnosis: Unsteadiness on feet (R26.81);Muscle weakness (generalized) (M62.81);Difficulty in walking, not elsewhere classified (R26.2)    Time: 1324-4010 PT Time Calculation (min) (ACUTE ONLY): 23 min   Charges:   PT Evaluation $PT Eval Moderate Complexity: 1 Mod PT Treatments $Therapeutic Activity: 8-22 mins       Jatavis Malek U PT, DPT, PN2   Supplemental  Physical Therapist [place]    Pager [phone] Acute Rehab Office [phone]    

## 2021-06-20 NOTE — Progress Notes (Signed)
Patient attempted to get out of bed. Patient was confused as to where he was. Patient was reoriented. Floor mats placed; fall risk band placed. Patient's external catheter replaced, sheets changes. Patient resting comfortably. Will continue to monitor

## 2021-06-20 NOTE — Progress Notes (Signed)
Mobility Specialist: Progress Note   06/20/21 1125  Mobility  Activity Transferred:  Bed to chair  Level of Assistance +2 (takes two people)  Press photographer wheel walker  Distance Ambulated (ft) 2 ft  Mobility Out of bed to chair with meals  Mobility Response Tolerated fair  Mobility performed by Mobility specialist  Bed Position Chair  $Mobility charge 1 Mobility   Pre-Mobility: 58 HR, 145/59 BP, 97% SpO2 Post-Mobility: 61 HR, 137/54 BP, 97% SpO2  Pt required minA to sit EOB and modA +2 to stand from EOB. Pt c/o feeling weak but agreeable to transfer to the chair. Pt required maxA +2 to transfer taking hop steps as well as verbal cues for foot placement and physical assistance for RW placement. Had to move chair closer so pt could sit down d/t fatigue. Pt has call bell and phone in his lap and chair alarm is on.   [facility] Jelisa [place] Mobility Specialist Mobility Specialist Phone: [phone]

## 2021-06-21 DIAGNOSIS — L089 Local infection of the skin and subcutaneous tissue, unspecified: Secondary | ICD-10-CM | POA: Diagnosis not present

## 2021-06-21 DIAGNOSIS — E11628 Type 2 diabetes mellitus with other skin complications: Secondary | ICD-10-CM | POA: Diagnosis not present

## 2021-06-21 DIAGNOSIS — E440 Moderate protein-calorie malnutrition: Secondary | ICD-10-CM

## 2021-06-21 DIAGNOSIS — I96 Gangrene, not elsewhere classified: Secondary | ICD-10-CM | POA: Diagnosis not present

## 2021-06-21 LAB — BASIC METABOLIC PANEL
Anion gap: 7 (ref 5–15)
BUN: 42 mg/dL — ABNORMAL HIGH (ref 8–23)
CO2: 20 mmol/L — ABNORMAL LOW (ref 22–32)
Calcium: 8.3 mg/dL — ABNORMAL LOW (ref 8.9–10.3)
Chloride: 117 mmol/L — ABNORMAL HIGH (ref 98–111)
Creatinine, Ser: 1.82 mg/dL — ABNORMAL HIGH (ref 0.61–1.24)
GFR, Estimated: 35 mL/min — ABNORMAL LOW (ref >60)
Glucose, Bld: 175 mg/dL — ABNORMAL HIGH (ref 70–99)
Potassium: 3.5 mmol/L (ref 3.5–5.1)
Sodium: 144 mmol/L (ref 135–145)

## 2021-06-21 LAB — GLUCOSE, CAPILLARY
Glucose-Capillary: 157 mg/dL — ABNORMAL HIGH (ref 70–99)
Glucose-Capillary: 151 mg/dL — ABNORMAL HIGH (ref 70–99)
Glucose-Capillary: 158 mg/dL — ABNORMAL HIGH (ref 70–99)
Glucose-Capillary: 156 mg/dL — ABNORMAL HIGH (ref 70–99)

## 2021-06-21 LAB — URINALYSIS, ROUTINE W REFLEX MICROSCOPIC
Bilirubin Urine: NEGATIVE
Glucose, UA: NEGATIVE mg/dL
Ketones, ur: NEGATIVE mg/dL
Leukocytes,Ua: NEGATIVE
Nitrite: NEGATIVE
Protein, ur: 100 mg/dL — AB
Specific Gravity, Urine: 1.012 (ref 1.005–1.030)
pH: 5 (ref 5.0–8.0)

## 2021-06-21 LAB — CBC
HCT: 28.7 % — ABNORMAL LOW (ref 39.0–52.0)
Hemoglobin: 9.3 g/dL — ABNORMAL LOW (ref 13.0–17.0)
MCH: 28.9 pg (ref 26.0–34.0)
MCHC: 32.4 g/dL (ref 30.0–36.0)
MCV: 89.1 fL (ref 80.0–100.0)
Platelets: 155 10*3/uL (ref 150–400)
RBC: 3.22 MIL/uL — ABNORMAL LOW (ref 4.22–5.81)
RDW: 13.5 % (ref 11.5–15.5)
WBC: 6.3 10*3/uL (ref 4.0–10.5)
nRBC: 0 % (ref 0.0–0.2)

## 2021-06-21 LAB — SURGICAL PATHOLOGY

## 2021-06-21 MED ORDER — LACTATED RINGERS IV BOLUS
1000.0000 mL | Freq: Once | INTRAVENOUS | Status: AC
  Administered 2021-06-21: 1000 mL via INTRAVENOUS

## 2021-06-21 MED FILL — Heparin Sod (Porcine)-NaCl IV Soln 1000 Unit/500ML-0.9%: INTRAVENOUS | Qty: 1000 | Status: AC

## 2021-06-21 NOTE — Progress Notes (Signed)
Mobility Specialist: Progress Note   06/21/21 1750  Mobility  Activity Stood at bedside  Level of Assistance Minimal assist, patient does 75% or more  Assistive Device Stedy  Mobility Out of bed to chair with meals (Stood EOB)  Mobility Response Tolerated well  Mobility performed by Mobility specialist  $Mobility charge 1 Mobility   Pre-Mobility: 54 HR, 134/47 BP, 98% SpO2 Post-Mobility: 59 HR, 140/57 BP, 96% SpO2  Pt required minA to sit EOB as well as to stand from EOB with bed slightly elevated. Pt said his pain is minimal today, just feels weak. Pt back to bed with call bell and phone at his side and bed alarm is on. Pt is set-up with his dinner in the bed.   [facility] Stavros Cail Mobility Specialist Mobility Specialist Phone: [phone]

## 2021-06-21 NOTE — NC FL2 (Signed)
[place] MEDICAID FL2 LEVEL OF CARE SCREENING TOOL     IDENTIFICATION  Patient Name: Cameron Ruiz Birthdate: 21-Feb-1931 Sex: male Admission Date (Current Location): 06/11/2021  [place] and [place] Number:   [facility] [place])   Facility and Address:  [facility], [address] [place], [place] [address]      Provider Number: [id number]  Attending Physician Name and Address:  Inez Catalina, MD  Relative Name and Phone Number:       Current Level of Care: Hospital Recommended Level of Care: Skilled Nursing Facility Prior Approval Number:    Date Approved/Denied:   PASRR Number: under review  Discharge Plan: SNF    Current Diagnoses: Patient Active Problem List   Diagnosis Date Noted   Gangrene (HCC) 06/19/2021   Acute renal failure superimposed on stage 3a chronic kidney disease (HCC) 06/19/2021   Atrial fibrillation (HCC) 06/19/2021   Diabetes (HCC) 06/19/2021   Malnutrition of moderate degree 06/17/2021   Pressure injury of skin 06/14/2021   Diabetic foot infection (HCC)     Orientation RESPIRATION BLADDER Height & Weight        Normal External catheter, Incontinent Weight: 205 lb 7.5 oz (93.2 kg) Height:  6\' 1"  (185.4 cm) (stated)  BEHAVIORAL SYMPTOMS/MOOD NEUROLOGICAL BOWEL NUTRITION STATUS      Continent Diet (please see discharge summary)  AMBULATORY STATUS COMMUNICATION OF NEEDS Skin   Limited Assist Verbally Surgical wounds (pressure injury RT & LF Toe  Posterior Unstageable, Pressure Injurt Buttocks LF Stage II, Closed Incision LF Leg)                       Personal Care Assistance Level of Assistance  Bathing, Feeding, Dressing Bathing Assistance: Limited assistance Feeding assistance: Independent Dressing Assistance: Limited assistance     Functional Limitations Info  Sight, Hearing, Speech Sight Info: Impaired Hearing Info: Adequate Speech Info: Adequate    SPECIAL CARE FACTORS FREQUENCY  PT (By  licensed PT), OT (By licensed OT)     PT Frequency: 5x per week OT Frequency: 5x per week            Contractures Contractures Info: Not present    Additional Factors Info  Code Status, Allergies Code Status Info: FULL Allergies Info: Carbocaine,Cogentin,Fluogen,Keflex,Kenalog,Levaquin           Current Medications (06/21/2021):  This is the current hospital active medication list Current Facility-Administered Medications  Medication Dose Route Frequency Provider Last Rate Last Admin   0.9 %  sodium chloride infusion  250 mL Intravenous PRN 08/21/2021, PA-C       acetaminophen (TYLENOL) tablet 650 mg  650 mg Oral Q6H Emilie Rutter, MD   650 mg at 06/21/21 08/21/21   apixaban (ELIQUIS) tablet 2.5 mg  2.5 mg Oral BID 0160, MD   2.5 mg at 06/21/21 08/21/21   aspirin chewable tablet 81 mg  81 mg Oral Daily 1093, MD   81 mg at 06/21/21 08/21/21   carvedilol (COREG) tablet 6.25 mg  6.25 mg Oral BID 2355, PA-C   6.25 mg at 06/21/21 08/21/21   feeding supplement (ENSURE ENLIVE / ENSURE PLUS) liquid 237 mL  237 mL Oral TID BM 7322, PA-C   237 mL at 06/21/21 1334   gabapentin (NEURONTIN) capsule 100 mg  100 mg Oral TID 08/21/21, MD   100 mg at 06/21/21 08/21/21   hydrALAZINE (APRESOLINE) tablet 100 mg  100 mg  Oral TID Emilie Rutter, PA-C   100 mg at 06/21/21 1610   insulin aspart (novoLOG) injection 0-9 Units  0-9 Units Subcutaneous TID WC Emilie Rutter, PA-C   2 Units at 06/21/21 1214   lip balm (CARMEX) ointment 1 application  1 application Topical PRN Emilie Rutter, PA-C   1 application at 06/17/21 0330   melatonin tablet 3 mg  3 mg Oral QHS PRN Emilie Rutter, PA-C   3 mg at 06/17/21 2240   multivitamin with minerals tablet 1 tablet  1 tablet Oral Daily Emilie Rutter, PA-C   1 tablet at 06/21/21 9604   Muscle Rub CREA 1 application  1 application Topical PRN Emilie Rutter, PA-C       ondansetron [facility]) injection 4 mg  4 mg  Intravenous Q6H PRN Emilie Rutter, PA-C   4 mg at 06/17/21 5409   oxyCODONE (Oxy IR/ROXICODONE) immediate release tablet 5-10 mg  5-10 mg Oral Q4H PRN Leonie Douglas, MD   10 mg at 06/21/21 1052   pantoprazole (PROTONIX) EC tablet 40 mg  40 mg Oral Daily Emilie Rutter, PA-C   40 mg at 06/21/21 8119   polyethylene glycol (MIRALAX / GLYCOLAX) packet 17 g  17 g Oral Daily Emilie Rutter, PA-C   17 g at 06/21/21 1478   senna-docusate (Senokot-S) tablet 1 tablet  1 tablet Oral BID Emilie Rutter, PA-C   1 tablet at 06/21/21 2956   simvastatin (ZOCOR) tablet 20 mg  20 mg Oral QHS Emilie Rutter, PA-C   20 mg at 06/20/21 2117   sodium chloride flush (NS) 0.9 % injection 10-40 mL  10-40 mL Intracatheter Q12H Emilie Rutter, PA-C   10 mL at 06/21/21 2130   sodium chloride flush (NS) 0.9 % injection 3 mL  3 mL Intravenous Q12H Emilie Rutter, PA-C   3 mL at 06/18/21 2344   sodium chloride flush (NS) 0.9 % injection 3 mL  3 mL Intravenous Q12H Emilie Rutter, PA-C   3 mL at 06/21/21 8657   sodium chloride flush (NS) 0.9 % injection 3 mL  3 mL Intravenous PRN Emilie Rutter, PA-C       torsemide [facility]) tablet 10 mg  10 mg Oral q morning Emilie Rutter, PA-C   10 mg at 06/21/21 8469     Discharge Medications: Please see discharge summary for a list of discharge medications.  Relevant Imaging Results:  Relevant Lab Results:   Additional Information SSN [id number] no covid vaccine  Eduard Roux, LCSW

## 2021-06-21 NOTE — Progress Notes (Signed)
Orthopedic Tech Progress Note Patient Details:  Cameron Ruiz June 21, 1931 [id number]  Called in order to [facility] for an AMPUSHIELD BK with [facility]   Patient ID: Etzkorn Heir, male   DOB: 1931-08-27, 87 y.o.   MRN: [id number]  Donald Pore 06/21/2021, 8:21 AM

## 2021-06-21 NOTE — Progress Notes (Signed)
   Subjective: I seen and evaluated Cameron Ruiz at bedside.  Cameron Ruiz was lying comfortably in bed.  Cameron Ruiz states that Cameron Ruiz is in no pain currently.  Cameron Ruiz acknowledges that Cameron Ruiz is currently awaiting placement at SNF.   Objective:  Vital signs in last 24 hours: Vitals:   06/21/21 0329 06/21/21 0400 06/21/21 0800 06/21/21 1120  BP: (!) 108/40 (!) 150/50 (!) 155/56 (!) 132/50  Pulse: 65 (!) 54 (!) 55 (!) 57  Resp: 18 20 17 14   Temp: 98.7 F (37.1 C)  98.8 F (37.1 C) 98.5 F (36.9 C)  TempSrc: Oral  Oral Oral  SpO2: 98% 98% 96% 95%  Weight: 93.2 kg     Height:       Physical Exam HENT:     Head: Normocephalic and atraumatic.  Cardiovascular:     Rate and Rhythm: Normal rate.     Heart sounds: Normal heart sounds.  Pulmonary:     Breath sounds: Normal breath sounds.  Abdominal:     Palpations: Abdomen is soft.  Musculoskeletal:     Right lower leg: No edema.     Left lower leg: No edema.     Comments: Left residual limb was bandaged appropriately.  No sign of drainage through the bandage.     Left Lower Extremity: Left leg is amputated below knee.  Skin:    General: Skin is warm and dry.  Neurological:     General: No focal deficit present.     Mental Status: Cameron Ruiz is alert.  Psychiatric:        Attention and Perception: Attention normal.        Behavior: Behavior normal. Behavior is cooperative.     Assessment/Plan:  Principal Problem:   Diabetic foot infection (HCC) Active Problems:   Pressure injury of skin   Malnutrition of moderate degree   Gangrene (HCC)   Acute renal failure superimposed on stage 3a chronic kidney disease (HCC)   Atrial fibrillation (HCC)   Diabetes (HCC) L diabetic foot infection complicated by PVD, now POD4 L BKA Patient has been doing well since surgical intervention four days ago. Antibiotics have been discontinued as source has been controlled. No other signs of infection. Appreciate vascular recommendations. Will continue to work on pain management  as needed. Physical therapy to continue with patient, recommendations for SNF.  -Pending case management for placement at SNF - Continue Eliquis  - Pain management: Acetaminophen 650mg  q6h, oxycodone 5-10mg  q4h PRN - Dressings per surgical team - PT/OT  Paroxysmal atrial fibrillation Tele reviewed, unchanged from yesterday - Carvedilol 6.25mg  twice daily - Eliquis 2.5mg  twice daily   #Type 2 diabetes mellitus Most recent A1c 5.9%. Sugars have been at goal to promote wound healing. Will continue sliding scale. - SSI   #AKI vs CKD No previous history of chronic kidney disease, no baseline. One liter bolus IVF given yesterday due to appearing dry on physical exam evidenced by decreased skin turgor and dry mucus membrane. Creatinine still elevated at 1.82.  - Repeat BMP in the AM - Strict I/O   #Coronary artery disease s/p CABG - Continue home aspirin, simvastatin   Prior to Admission Living Arrangement: Anticipated Discharge Location: Barriers to Discharge: Dispo: Anticipated discharge in approximately 2-3 day(s).   , MD 06/21/2021, 12:40 PM Pager: [phone] After 5pm on weekdays and 1pm on weekends: On Call pager [phone]

## 2021-06-21 NOTE — Progress Notes (Addendum)
   VASCULAR SURGERY ASSESSMENT & PLAN:   POD 3 left BKA: Gangrene of left leg with peripheral vascular disease. Flaps well perfused. Will order [facility].  Dispo: SNF  SUBJECTIVE:   No current pain  PHYSICAL EXAM:   Vitals:   06/20/21 1953 06/20/21 2322 06/21/21 0329 06/21/21 0400  BP: (!) 124/44 (!) 130/48 (!) 108/40 (!) 150/50  Pulse: 65 70 65 (!) 54  Resp: 20 20 18 20   Temp: 98.7 F (37.1 C) 98.7 F (37.1 C) 98.7 F (37.1 C)   TempSrc: Oral Oral Oral   SpO2: 99% 97% 98% 98%  Weight:   93.2 kg   Height:       General appearance: Awake, alert in no apparent distress Cardiac: Heart rate and rhythm are regular Respirations: Nonlabored Extremities: Left BKA site incision is well approximated without bleeding or hematoma.  Anterior and posterior flaps are warm and well-perfused      LABS:   Lab Results  Component Value Date   WBC 6.3 06/21/2021   HGB 9.3 (L) 06/21/2021   HCT 28.7 (L) 06/21/2021   MCV 89.1 06/21/2021   PLT 155 06/21/2021   Lab Results  Component Value Date   CREATININE 1.82 (H) 06/21/2021   Lab Results  Component Value Date   INR 1.2 06/12/2021   CBG (last 3)  Recent Labs    06/20/21 1626 06/20/21 2032 06/21/21 0636  GLUCAP 253* 213* 157*    PROBLEM LIST:    Principal Problem:   Diabetic foot infection (HCC) Active Problems:   Pressure injury of skin   Malnutrition of moderate degree   Gangrene (HCC)   Acute renal failure superimposed on stage 3a chronic kidney disease (HCC)   Atrial fibrillation (HCC)   Diabetes (HCC)   CURRENT MEDS:    acetaminophen  650 mg Oral Q6H   apixaban  2.5 mg Oral BID   aspirin  81 mg Oral Daily   carvedilol  6.25 mg Oral BID   feeding supplement  237 mL Oral TID BM   gabapentin  100 mg Oral TID   hydrALAZINE  100 mg Oral TID   insulin aspart  0-9 Units Subcutaneous TID WC   multivitamin with minerals  1 tablet Oral Daily   pantoprazole  40 mg Oral Daily   polyethylene glycol  17 g Oral  Daily   senna-docusate  1 tablet Oral BID   simvastatin  20 mg Oral QHS   sodium chloride flush  10-40 mL Intracatheter Q12H   sodium chloride flush  3 mL Intravenous Q12H   sodium chloride flush  3 mL Intravenous Q12H   torsemide  10 mg Oral q morning   08/21/21, PA-C  Office: [phone] 06/21/2021    I agree with the above.  I have seen and evaluated the patient.  His dressing was changed today.  His stump is healing nicely.  He does have a slight contracture in his knee.  We will ask [facility] to evaluate him for a shrinker as well as a limb guard to help keep his knee straight.  08/21/2021

## 2021-06-21 NOTE — Social Work (Signed)
                                                                                                                          Re: Cameron Ruiz Date of Birth: 02-Feb-1931 Date: 06/21/2021  To Whom It May Concern:  Please be advised that the above-named patient will require a short-term nursing home stay-anticipated 30 days or less for rehabilitation and strengthening. The plan is to return home.

## 2021-06-21 NOTE — TOC Initial Note (Signed)
Transition of Care [facility]) - Initial/Assessment Note    Patient Details  Name: Cameron Ruiz MRN: [id number] Date of Birth: 03-21-1931  Transition of [facility]) CM/SW Contact:    Eduard Roux, LCSW Phone Number: 06/21/2021, 1:55 PM  Clinical Narrative:                  CSW spoke with patient's granddaughter,Melissa. CSW introduced self and explained role. CSW discussed with family , therapy recommendation of short term rehab at [facility]. Family agrees with recommendation. CSW explained the SNF process. Family prefers SNF in [place] area but can send referrals to [place] area as back up. CSW answered all questions.   CSW will fax referral to [facility] and provide bed offers once available.  CSW will continue to follow and assist with discharge planning.  Antony Blackbird, MSW, LCSW Clinical Social Worker    Expected Discharge Plan: Skilled Nursing Facility Barriers to Discharge: Awaiting State Approval (PASRR), Continued Medical Work up, SNF Pending bed offer   Patient Goals and CMS Choice        Expected Discharge Plan and Services Expected Discharge Plan: Skilled Nursing Facility In-house Referral: Clinical Social Work     Living arrangements for the past 2 months: Single Family Home                                      Prior Living Arrangements/Services Living arrangements for the past 2 months: Single Family Home Lives with:: Self Patient language and need for interpreter reviewed:: No        Need for Family Participation in Patient Care: Yes (Comment) Care giver support system in place?: Yes (comment)   Criminal Activity/Legal Involvement Pertinent to Current Situation/Hospitalization: No - Comment as needed  Activities of Daily Living Home Assistive Devices/Equipment: None ADL Screening (condition at time of admission) Patient's cognitive ability adequate to safely complete daily activities?: Yes Is the patient deaf or have difficulty  hearing?: No Does the patient have difficulty seeing, even when wearing glasses/contacts?: No Does the patient have difficulty concentrating, remembering, or making decisions?: No Patient able to express need for assistance with ADLs?: Yes Does the patient have difficulty dressing or bathing?: Yes Independently performs ADLs?: No Does the patient have difficulty walking or climbing stairs?: Yes Weakness of Legs: Right Weakness of Arms/Hands: None  Permission Sought/Granted Permission sought to share information with : Family Supports                Emotional Assessment         Alcohol / Substance Use: Not Applicable Psych Involvement: No (comment)  Admission diagnosis:  Cellulitis [L03.90] Cellulitis of foot, left [L03.116] Diabetic foot infection (HCC) [F81.829, L08.9] Patient Active Problem List   Diagnosis Date Noted   Gangrene (HCC) 06/19/2021   Acute renal failure superimposed on stage 3a chronic kidney disease (HCC) 06/19/2021   Atrial fibrillation (HCC) 06/19/2021   Diabetes (HCC) 06/19/2021   Malnutrition of moderate degree 06/17/2021   Pressure injury of skin 06/14/2021   Diabetic foot infection (HCC)    PCP:  Glori Bickers, MD Pharmacy:   [facility] [phone] [place], [place] - [address] AT [facility] [address] [place] [address] Phone: [phone] Fax: [phone]     Social Determinants of Health (SDOH) Interventions    Readmission Risk Interventions No flowsheet data found.

## 2021-06-21 NOTE — Progress Notes (Signed)
Occupational Therapy Treatment/Re-evaluation Patient Details Name: Cameron Ruiz MRN: [id number] DOB: 03-Mar-1931 Today's Date: 06/21/2021    History of present illness Cameron Ruiz is a 87 y.o. male admitted on 06/11/2021 with osteomyelitis of left foot wound. 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 PMH includes diabetes on insulin, cardiovascular disease s/p CABG, HTN, and HLD.   OT comments  Pt seen for first OT session s/p L BKA. Pt pleasant and receptive of all education (re: limb protector, UB strengthening, impact of amputation on balance, etc). Pt able to demo sit to stands from elevated bed in [place] at Mod A. Time spent focusing on achieving upright posture and allowing pt to find center of gravity to reduce risk of falls during transfers. Began education on compensatory strategies for LB ADLs with plans to further address as well as UE HEP as appropriate. Continue to recommend SNF rehab at [place].    Follow Up Recommendations  SNF;Supervision/Assistance - 24 hour    Equipment Recommendations  3 in 1 bedside commode;Wheelchair (measurements OT);Wheelchair cushion (measurements OT)    Recommendations for Other Services      Precautions / Restrictions Precautions Precautions: Fall;Other (comment) Precaution Comments: poor vision, new L BKA Restrictions Weight Bearing Restrictions: Yes LLE Weight Bearing: Non weight bearing       Mobility Bed Mobility Overal bed mobility: Needs Assistance Bed Mobility: Supine to Sit;Sit to Supine     Supine to sit: Mod assist;HOB elevated Sit to supine: Max assist   General bed mobility comments: Mod A to bring hips forward (able to assist LEs off of bed) and lift trunk. good use of bed rails with cues. heavier assist to guide LEs and trunk back into bed. Pt able to use B UE well to pull self up in bed with Mod A    Transfers Overall transfer level: Needs assistance Equipment used: Ambulation equipment used Transfers: Sit to/from [employer] Sit to Stand: Mod assist;From elevated surface         General transfer comment: Mod A for sit to stand in [place] and Min A from [employer] Pt noted with difficulty achieving upright posture, able to maintain > 20 seconds in standing in frame    Balance Overall balance assessment: Needs assistance Sitting-balance support: No upper extremity supported;Feet unsupported Sitting balance-Leahy Scale: Fair     Standing balance support: During functional activity;Bilateral upper extremity supported Standing balance-Leahy Scale: Poor Standing balance comment: reliant on UE support in [place]                           ADL either performed or assessed with clinical judgement   ADL Overall ADL's : Needs assistance/impaired                     Lower Body Dressing: Maximal assistance;Bed level;Sitting/lateral leans Lower Body Dressing Details (indicate cue type and reason): Assist to manage limb protector in bed. participatory in education on fit and assisting by lifting leg, etc               General ADL Comments: Pt demo decent UB strength to assist with bed mobility and transfers though limited by impaired standing balance, minor confusion and decreased endurance     Vision   Vision Assessment?: Vision impaired- to be further tested in functional context Additional Comments: endorses blurred vision and cloudy appearance - unable to specify visual deficits   Perception     Praxis  Cognition Arousal/Alertness: Awake/alert Behavior During Therapy: Flat affect Overall Cognitive Status: Impaired/Different from baseline Area of Impairment: Following commands;Safety/judgement;Problem solving;Awareness                       Following Commands: Follows one step commands with increased time;Follows multi-step commands with increased time Safety/Judgement: Decreased awareness of safety;Decreased awareness of deficits Awareness:  Emergent Problem Solving: Slow processing;Decreased initiation;Difficulty sequencing;Requires verbal cues;Requires tactile cues General Comments: Pt pleasant and motivated to attempt all tasks, benefits from multimodal cues and breakdown of task steps. able to follow directions consistently. Able to answer orientation questions though RN had noted some confusion statements (asking if it was night out though blinds open and mid-afternoon)        Exercises     Shoulder Instructions       General Comments HR 50sbpm, SpO2 100% on RA    Pertinent Vitals/ Pain       Pain Assessment: 0-10 Pain Score: 4  Pain Location: L BKA incision site Pain Descriptors / Indicators: Sore;Shooting Pain Intervention(s): Monitored during session;Premedicated before session;Other (comment) (Rn applied gel to residual limb)  Home Living                                          Prior Functioning/Environment              Frequency  Min 2X/week        Progress Toward Goals  OT Goals(current goals can now be found in the care plan section)  Progress towards OT goals: OT to reassess next treatment  Acute Rehab OT Goals Patient Stated Goal: drink a mt dew OT Goal Formulation: With patient Time For Goal Achievement: 06/27/21 Potential to Achieve Goals: Fair ADL Goals Pt Will Perform Grooming: with modified independence;standing Pt Will Perform Upper Body Bathing: with modified independence;sitting Pt Will Perform Lower Body Bathing: with modified independence;sitting/lateral leans Pt Will Perform Upper Body Dressing: with modified independence;sitting Pt Will Perform Lower Body Dressing: with modified independence;sit to/from stand Pt Will Transfer to Toilet: with modified independence;ambulating;grab bars Pt Will Perform Toileting - Clothing Manipulation and hygiene: with modified independence;sitting/lateral leans  Plan Discharge plan needs to be updated;Frequency remains  appropriate    Co-evaluation                 AM-PAC OT "6 Clicks" Daily Activity     Outcome Measure   Help from another person eating meals?: A Little Help from another person taking care of personal grooming?: A Little Help from another person toileting, which includes using toliet, bedpan, or urinal?: A Lot Help from another person bathing (including washing, rinsing, drying)?: A Little Help from another person to put on and taking off regular upper body clothing?: A Lot Help from another person to put on and taking off regular lower body clothing?: A Lot 6 Click Score: 15    End of Session Equipment Utilized During Treatment: Gait belt  OT Visit Diagnosis: Unsteadiness on feet (R26.81);Muscle weakness (generalized) (M62.81);Pain Pain - Right/Left: Left Pain - part of body: Leg   Activity Tolerance Patient tolerated treatment well   Patient Left in bed;with call bell/phone within reach;with bed alarm set   Nurse Communication Mobility status;Other (comment) (request for mt dew)        Time: 4496-7591 OT Time Calculation (min): 34 min  Charges: OT General Charges $OT  Visit: 1 Visit OT Evaluation $OT Re-eval: 1 Re-eval OT Treatments $Therapeutic Activity: 8-22 mins  Bradd Canary, OTR/L Acute Rehab Services Office: [phone]    Lorre Munroe 06/21/2021, 2:14 PM

## 2021-06-22 LAB — BASIC METABOLIC PANEL
Anion gap: 6 (ref 5–15)
BUN: 39 mg/dL — ABNORMAL HIGH (ref 8–23)
CO2: 22 mmol/L (ref 22–32)
Calcium: 8.2 mg/dL — ABNORMAL LOW (ref 8.9–10.3)
Chloride: 115 mmol/L — ABNORMAL HIGH (ref 98–111)
Creatinine, Ser: 1.58 mg/dL — ABNORMAL HIGH (ref 0.61–1.24)
GFR, Estimated: 42 mL/min — ABNORMAL LOW (ref >60)
Glucose, Bld: 161 mg/dL — ABNORMAL HIGH (ref 70–99)
Potassium: 3.2 mmol/L — ABNORMAL LOW (ref 3.5–5.1)
Sodium: 143 mmol/L (ref 135–145)

## 2021-06-22 LAB — GLUCOSE, CAPILLARY
Glucose-Capillary: 164 mg/dL — ABNORMAL HIGH (ref 70–99)
Glucose-Capillary: 225 mg/dL — ABNORMAL HIGH (ref 70–99)
Glucose-Capillary: 228 mg/dL — ABNORMAL HIGH (ref 70–99)
Glucose-Capillary: 229 mg/dL — ABNORMAL HIGH (ref 70–99)

## 2021-06-22 LAB — PROTEIN / CREATININE RATIO, URINE
Creatinine, Urine: 92.52 mg/dL
Protein Creatinine Ratio: 3.38 mg/mg{Cre} — ABNORMAL HIGH (ref 0.00–0.15)
Total Protein, Urine: 313 mg/dL

## 2021-06-22 MED ORDER — LACTATED RINGERS IV BOLUS
1000.0000 mL | Freq: Once | INTRAVENOUS | Status: AC
  Administered 2021-06-22: 1000 mL via INTRAVENOUS

## 2021-06-22 MED ORDER — POTASSIUM CHLORIDE CRYS ER 20 MEQ PO TBCR
40.0000 meq | EXTENDED_RELEASE_TABLET | Freq: Two times a day (BID) | ORAL | Status: DC
  Administered 2021-06-22 – 2021-06-24 (×5): 40 meq via ORAL
  Filled 2021-06-22 (×5): qty 2

## 2021-06-22 NOTE — Progress Notes (Signed)
Mobility Specialist: Progress Note   06/22/21 1108  Mobility  Activity Transferred:  Bed to chair  Level of Assistance Minimal assist, patient does 75% or more  Assistive Device Stedy  Mobility Out of bed to chair with meals  Mobility Response Tolerated well  Mobility performed by Mobility specialist  Bed Position Chair  $Mobility charge 1 Mobility   Pre-Mobility: 54 HR, 162/59 BP, 96% SpO2 Post-Mobility: 57 HR, 151/54 BP, 100% SpO2  Pt asleep upon entering room but easily awakened and agreeable to mobility. Pt required minA for bed mobility as well as to stand at the [place]. Once in the chair pt performed one more STS and was able to stand for roughly 30 seconds before needing to sit. Pt is in the recliner with call bell and phone at his side. Chair alarm is on.   [facility] Wilho Sharpley Mobility Specialist Mobility Specialist Phone: [phone]

## 2021-06-22 NOTE — Progress Notes (Addendum)
  Progress Note    06/22/2021 7:22 AM 4 Days Post-Op  Subjective:  no complaints  Afebrile   Vitals:   06/22/21 0324 06/22/21 0327  BP: (!) 158/53 (!) 158/53  Pulse: (!) 56 85  Resp: 18 20  Temp: 98.1 F (36.7 C) 98.6 F (37 C)  SpO2: 97% 96%    Physical Exam: General:  no distress Lungs:  non labored Incisions:  shrinker and limb guard in place   CBC    Component Value Date/Time   WBC 6.3 06/21/2021 0134   RBC 3.22 (L) 06/21/2021 0134   HGB 9.3 (L) 06/21/2021 0134   HCT 28.7 (L) 06/21/2021 0134   PLT 155 06/21/2021 0134   MCV 89.1 06/21/2021 0134   MCH 28.9 06/21/2021 0134   MCHC 32.4 06/21/2021 0134   RDW 13.5 06/21/2021 0134   LYMPHSABS 1.3 06/11/2021 1732   MONOABS 0.5 06/11/2021 1732   EOSABS 0.2 06/11/2021 1732   BASOSABS 0.0 06/11/2021 1732    BMET    Component Value Date/Time   NA 143 06/22/2021 0119   K 3.2 (L) 06/22/2021 0119   CL 115 (H) 06/22/2021 0119   CO2 22 06/22/2021 0119   GLUCOSE 161 (H) 06/22/2021 0119   BUN 39 (H) 06/22/2021 0119   CREATININE 1.58 (H) 06/22/2021 0119   CALCIUM 8.2 (L) 06/22/2021 0119   GFRNONAA 42 (L) 06/22/2021 0119    INR    Component Value Date/Time   INR 1.2 06/12/2021 2016     Intake/Output Summary (Last 24 hours) at 06/22/2021 9163 Last data filed at 06/22/2021 0350 Gross per 24 hour  Intake 440 ml  Output 1650 ml  Net -1210 ml     Assessment/Plan:  87 y.o. male is s/p:  Left BKA  4 Days Post-Op   -ampushield (limb protector) in place for mild knee contracture.  Discussed with pt to work on straightening his knee -awaiting placement -will f/u with our office is 4-5 weeks for staple removal.  Our office will arrange this.     Doreatha Massed, PA-C [facility] [phone] 06/22/2021 7:22 AM

## 2021-06-22 NOTE — Progress Notes (Signed)
Subjective:  Cameron Ruiz was evaluated and examined at the bedside today. He states he is not in any pain today, and states he is feeling well overall. He says he knows he has a long journey ahead of him with respect to learning to live with his amputation, but he is going to take it one step at a time.  Objective:  Vital signs in last 24 hours: Vitals:   06/21/21 2318 06/22/21 0324 06/22/21 0327 06/22/21 0804  BP: (!) 145/48 (!) 158/53 (!) 158/53 (!) 161/56  Pulse: (!) 55 (!) 56 85 (!) 55  Resp: 18 18 20 20   Temp: 98.6 F (37 C) 98.1 F (36.7 C) 98.6 F (37 C) 98.6 F (37 C)  TempSrc: Oral Oral Oral Oral  SpO2: 99% 97% 96% 98%  Weight:   93.6 kg   Height:       Weight change: 0.4 kg  Intake/Output Summary (Last 24 hours) at 06/22/2021 0943 Last data filed at 06/22/2021 08/22/2021 Gross per 24 hour  Intake 680 ml  Output 1900 ml  Net -1220 ml   Physical Exam: Constitutional: well-appearing and sitting up in bed, in no acute distress HENT: tongue is dry Pulmonary/Chest: normal work of breathing Extremities: ampushield in place on left lower residual limb  Pertinent Labs: BMP Latest Ref Rng & Units 06/22/2021 06/21/2021 06/20/2021  Glucose 70 - 99 mg/dL 08/20/2021) 950(D) 326(Z)  BUN 8 - 23 mg/dL 124(P) 80(D) 98(P)  Creatinine 0.61 - 1.24 mg/dL 38(S) 5.05(L) 9.76(B)  Sodium 135 - 145 mmol/L 143 144 143  Potassium 3.5 - 5.1 mmol/L 3.2(L) 3.5 3.6  Chloride 98 - 111 mmol/L 115(H) 117(H) 117(H)  CO2 22 - 32 mmol/L 22 20(L) 18(L)  Calcium 8.9 - 10.3 mg/dL 8.2(L) 8.3(L) 8.4(L)   Urinalysis    Component Value Date/Time   COLORURINE YELLOW 06/21/2021 1025   APPEARANCEUR HAZY (A) 06/21/2021 1025   LABSPEC 1.012 06/21/2021 1025   PHURINE 5.0 06/21/2021 1025   GLUCOSEU NEGATIVE 06/21/2021 1025   HGBUR SMALL (A) 06/21/2021 1025   BILIRUBINUR NEGATIVE 06/21/2021 1025   KETONESUR NEGATIVE 06/21/2021 1025   PROTEINUR 100 (A) 06/21/2021 1025   NITRITE NEGATIVE 06/21/2021 1025    LEUKOCYTESUR NEGATIVE 06/21/2021 1025   CBG (last 3)  Recent Labs    06/21/21 1541 06/21/21 2018 06/22/21 0617  GLUCAP 158* 156* 164*   Assessment/Plan:  Principal Problem:   Diabetic foot infection (HCC) Active Problems:   Proteinuria   Pressure injury of skin   Malnutrition of moderate degree   Gangrene (HCC)   Acute renal failure superimposed on stage 3a chronic kidney disease (HCC)   Atrial fibrillation (HCC)   Diabetes (HCC)  Patient Summary: Cameron Ruiz is a 87 year-old man with DM, CAD s/p CABG, HTN, HLD, and blindness who presented with painful wounds on the left foot and was admitted for management of a left diabetic foot wound with cellulitis/third digit osteomyelitis complicated by limb threatening ischemia secondary to severe PAD.  L diabetic foot infection with gangrene complicated by PVD, now POD4 L BKA Patient continues to do well since L BKA and does not report any pain today. PT/OT working with patient as he is awaiting SNF placement. Appreciate vascular surgery recommendations. -Continue pain management -Acetaminophen 650 mg q6h -oxycodone 5-10 mg q4h prn -Dressings/ampushield per surgical team -PT/OT -SNF placement per [facility] -Vascular surgery arranging f/u in 4-5 weeks for staple removal   AKI Cr 1.58 improved from 1.82 yesterday after 1L bolus IVF. Losartan  remains held. Cr 1.08 on admission, so room for improvement remains. Patient appeared dehydrated on exam today. -Continue holding losartan 100 mg daily -1L bolus IVF today -Monitor BMP -Consider restarting losartan if Cr continues to improve -Strict I/O  Proteinuria Patient with frothy appearing urine yesterday and a UA significant for protein at 100. Urine protein/creatinine ratio ordered to assess for potential nephrotic syndrome. -F/u Protein/Creatinine ratio  Moderate Malnutrition Patient is able to take liquids and solids by mouth and is focusing on continuing to oral intake. Patient  receiving daily Ensure. Weight is 93.3 kg up from 92.2 kg on 8/7. -Encourage oral intake -Continue Ensure -Monitor weight  HTN Blood pressures have been moderately elevated in the setting of holding losartan for AKI. -Carvedilol 6.25 mg BID -Hydralazine 100 mg TID -Holding losartan 100 mg daily  Paroxysmal atrial fibrillation -Carvedilol 6.25 mg twice daily -Eliquis 2.5 mg twice daily   T2DM Most recent A1c 5.9%. Sugars have been at goal to promote wound healing. Will continue sliding scale. - SSI  Pressure Injury Right Heel -Continue prevalon boot   CAD s/p CABG -Continue home aspirin, simvastatin   LOS: 10 days   Val Eagle, Medical Student 06/22/2021, 9:43 AM

## 2021-06-22 NOTE — Progress Notes (Signed)
Mobility Specialist: Progress Note   06/22/21 1318  Mobility  Activity Transferred:  Chair to bed  Level of Assistance Minimal assist, patient does 75% or more  Assistive Device Stedy  Mobility Out of bed to chair with meals  Mobility Response Tolerated well  Mobility performed by Mobility specialist;Nurse  $Mobility charge 1 Mobility   Assisted pt back to bed per RN request with assistance from RN. Pt did well with stand from the recliner. Pt back in the bed with call bell and phone at his side.   [facility] Terrah Decoster Mobility Specialist Mobility Specialist Phone: [phone]

## 2021-06-22 NOTE — TOC Progression Note (Signed)
Transition of Care [facility]) - Progression Note    Patient Details  Name: Cameron Ruiz MRN: [id number] Date of Birth: December 30, 1930  [facility]) CM/SW Contact  Eduard Roux, [place] Phone Number: 06/22/2021, 10:02 AM  Clinical Narrative:     CSW called [facility] Deboraha Sprang Admissions Coordinator - left voice message to return call.   Antony Blackbird, MSW, LCSW Clinical Social Worker    Expected Discharge Plan: Skilled Nursing Facility Barriers to Discharge: Awaiting State Approval [facility]), Continued Medical Work up, SNF Pending bed offer  Expected Discharge Plan and Services Expected Discharge Plan: Skilled Nursing Facility In-house Referral: Clinical Social Work     Living arrangements for the past 2 months: Single Family Home                                       Social Determinants of Health (SDOH) Interventions    Readmission Risk Interventions No flowsheet data found.

## 2021-06-22 NOTE — Care Management Important Message (Signed)
Important Message  Patient Details  Name: Cameron Ruiz MRN: [id number] Date of Birth: [birth year]   Medicare Important Message Given:  Yes     Renie Ora 06/22/2021, 9:45 AM

## 2021-06-22 NOTE — Progress Notes (Signed)
Initial Nutrition Assessment  DOCUMENTATION CODES:   Non-severe (moderate) malnutrition in context of acute illness/injury  INTERVENTION:   Recommend Cortrak placement tomorrow given poor PO x 10 days   Once Cortrak placed:  -Osmolite 1.5 @ 20 ml/hr -Increase by 10 ml Q6 hours to goal rate of 55 ml/hr (1320 ml) -ProSource TF 45 ml TID  Provides: 2100 kcals, 116 grams protein, 1006 ml free water. Monitor for refeeding.   No BM x 9 days- recommend addition of bowel regimen  Ensure Enlive po BID, each supplement provides 350 kcal and 20 grams of protein MVI with minerals daily   NUTRITION DIAGNOSIS:   Moderate Malnutrition related to acute illness as evidenced by energy intake < or equal to 50% for > or equal to 5 days, mild muscle depletion, mild fat depletion.  Ongoing  GOAL:   Patient will meet greater than or equal to 90% of their needs  Not meeting PO  MONITOR:   PO intake, Supplement acceptance, Weight trends, Labs, I & O's  REASON FOR ASSESSMENT:   Rounds    ASSESSMENT:   Patient with PMH significant for DM, CAD s/p CABG, HTN, HLD, and blindness. Presents this admission with diabetic L foot wound with third digit osteomyelitis.  8/5- s/p L BKA  Patient states his appetite has not improved. Eating a small cup of ice cream upon RD follow up. Meal was untouched. Last five meal completions charted as 0-25%. RN attempted yesterday and day prior to encourage patient to eat meals with assistance but patient refused. To promote post op healing, patient would benefit from Cortrak placement with EN (RD discussed with patient). Medical team to discuss Cortrak further with patient. Would likely benefit from palliative consult to help guide GOC conversations.   Noted no BM x 9 days. Had regimen in place. Recommend suppository or enema if no result in the next 24-48 hours.   Admission weight: 99.8 kg  Current weight: 93.6 kg  UOP: 1650 mlx 24 hrs   Medications: SS  novolog, miralax, 40 mEq Kcl BID, senokot Labs: K 3.2 (L) CBG 151-225  Diet Order:   Diet Order             Diet regular Room service appropriate? Yes with Assist; Fluid consistency: Thin  Diet effective now                   EDUCATION NEEDS:   Education needs have been addressed  Skin:  Skin Assessment: Skin Integrity Issues: Skin Integrity Issues:: Stage II, Unstageable Stage II: buttocks Incision: L leg  Last BM:  7/31  Height:   Ht Readings from Last 1 Encounters:  06/12/21 6\' 1"  (1.854 m)    Weight:   Wt Readings from Last 1 Encounters:  06/22/21 93.6 kg    BMI:  Body mass index is 27.22 kg/m.  Estimated Nutritional Needs:   Kcal:  2000-2200 kcal  Protein:  100-120 grams  Fluid:  >/= 2 L/day  08/22/21 MS, RD, LDN, CNSC Clinical Nutrition Pager listed in AMION

## 2021-06-23 LAB — BASIC METABOLIC PANEL
Anion gap: 8 (ref 5–15)
BUN: 35 mg/dL — ABNORMAL HIGH (ref 8–23)
CO2: 24 mmol/L (ref 22–32)
Calcium: 8.6 mg/dL — ABNORMAL LOW (ref 8.9–10.3)
Chloride: 115 mmol/L — ABNORMAL HIGH (ref 98–111)
Creatinine, Ser: 1.45 mg/dL — ABNORMAL HIGH (ref 0.61–1.24)
GFR, Estimated: 46 mL/min — ABNORMAL LOW (ref >60)
Glucose, Bld: 222 mg/dL — ABNORMAL HIGH (ref 70–99)
Potassium: 3.9 mmol/L (ref 3.5–5.1)
Sodium: 147 mmol/L — ABNORMAL HIGH (ref 135–145)

## 2021-06-23 LAB — MAGNESIUM: Magnesium: 1.8 mg/dL (ref 1.7–2.4)

## 2021-06-23 LAB — GLUCOSE, CAPILLARY
Glucose-Capillary: 183 mg/dL — ABNORMAL HIGH (ref 70–99)
Glucose-Capillary: 265 mg/dL — ABNORMAL HIGH (ref 70–99)
Glucose-Capillary: 210 mg/dL — ABNORMAL HIGH (ref 70–99)
Glucose-Capillary: 208 mg/dL — ABNORMAL HIGH (ref 70–99)

## 2021-06-23 LAB — SARS CORONAVIRUS 2 (TAT 6-24 HRS): SARS Coronavirus 2: POSITIVE — AB

## 2021-06-23 MED ORDER — SODIUM CHLORIDE 0.45 % IV BOLUS
1000.0000 mL | Freq: Once | INTRAVENOUS | Status: AC
  Administered 2021-06-23: 1000 mL via INTRAVENOUS

## 2021-06-23 MED ORDER — SODIUM CHLORIDE 0.45 % IV SOLN: INTRAVENOUS | Status: AC

## 2021-06-23 MED ORDER — AMLODIPINE BESYLATE 5 MG PO TABS
5.0000 mg | ORAL_TABLET | Freq: Every day | ORAL | Status: DC
  Administered 2021-06-23 – 2021-06-24 (×2): 5 mg via ORAL
  Filled 2021-06-23 (×2): qty 1

## 2021-06-23 MED ORDER — INSULIN GLARGINE-YFGN 100 UNIT/ML ~~LOC~~ SOLN
5.0000 [IU] | Freq: Every day | SUBCUTANEOUS | Status: DC
  Administered 2021-06-23: 5 [IU] via SUBCUTANEOUS
  Filled 2021-06-23 (×2): qty 0.05

## 2021-06-23 NOTE — Progress Notes (Signed)
Mobility Specialist: Progress Note   06/23/21 1105  Mobility  Activity Sat and stood x 3  Level of Assistance Minimal assist, patient does 75% or more  Assistive Device Stedy  Mobility Out of bed to chair with meals  Mobility Response Tolerated well  Mobility performed by Mobility specialist  Bed Position Chair  $Mobility charge 1 Mobility   Pre-Mobility: 60 HR, 171/65 BP, 96% SpO2 Post-Mobility: 66 HR, 161/62 BP, 100% SpO2  Pt required minA with bed mobility as well as to stand from EOB. Pt assisted to the chair using the [facility] where he performed STS two more times, asx throughout. Pt has call bell and phone at his side and chair alarm is on.   [facility] Jamil Castillo Mobility Specialist Mobility Specialist Phone: [phone]

## 2021-06-23 NOTE — Progress Notes (Signed)
Mobility Specialist: Progress Note   06/23/21 1655  Mobility  Activity  (Wheelchair mobility)  Assistive Device Wheelchair  Mobility Response Tolerated well  Mobility performed by Mobility specialist;Other (comment) (PTA Carly)  $Mobility charge 1 Mobility   Assisted PTA Carly with pt performing wheelchair mobility. Pt required verbal cues for hand placement and direction. Pt asx throughout. Pt back to bed after mobility using minA with sliding board. Pt has call bell at his side.   [facility] Ashely Goosby Mobility Specialist Mobility Specialist Phone: [phone]

## 2021-06-23 NOTE — Progress Notes (Signed)
Inpatient Diabetes Program Recommendations  AACE/ADA: New Consensus Statement on Inpatient Glycemic Control (2015)  Target Ranges:  Prepandial:   less than 140 mg/dL      Peak postprandial:   less than 180 mg/dL (1-2 hours)      Critically ill patients:  140 - 180 mg/dL   Lab Results  Component Value Date   GLUCAP 183 (H) 06/23/2021   HGBA1C 5.9 (H) 06/12/2021    Review of Glycemic Control Results for Cameron Ruiz, Cameron Ruiz (MRN [id number]) as of 06/23/2021 11:19  Ref. Range 06/22/2021 06:17 06/22/2021 11:02 06/22/2021 16:31 06/22/2021 21:18 06/23/2021 06:30  Glucose-Capillary Latest Ref Range: 70 - 99 mg/dL 440 (H) 102 (H) 725 (H) 229 (H) 183 (H)   Diabetes history: DM2 Outpatient Diabetes medications: 70/30 30 units QAM & 20 units QPM Current orders for Inpatient glycemic control: Novolog 0-9 units TID  Inpatient Diabetes Program Recommendations:    Post Prandials elevated.  Please consider: Novolog 3 units TID with meals if consumes at least 50%  Will continue to follow while inpatient.  Thank you, Dulce Sellar, RN, BSN Diabetes Coordinator Inpatient Diabetes Program [phone] (team pager from 8a-5p)

## 2021-06-23 NOTE — Progress Notes (Signed)
Subjective:  Cameron Ruiz was evaluated and examined at the bedside this morning. He states he is feeling well and is not in any pain. He denies trouble breathing or palpitations. He is looking forward to getting back to [place] because he will be close to his family. His wife's sister passed away today, and he is keeping close by the phone in case she calls to speak with him.  He has his lunch in front of him and enjoys a tomato sandwich.  Objective:  Vital signs in last 24 hours: Vitals:   06/23/21 0200 06/23/21 0300 06/23/21 0357 06/23/21 0800  BP:   (!) 166/61 (!) 171/60  Pulse:   60 (!) 53  Resp: 20 20 20 15   Temp:   98.7 F (37.1 C) 98.1 F (36.7 C)  TempSrc:   Oral Oral  SpO2:   96% 96%  Weight:   91.5 kg   Height:       Weight change: -2.1 kg  Intake/Output Summary (Last 24 hours) at 06/23/2021 08/23/2021 Last data filed at 06/23/2021 0357 Gross per 24 hour  Intake 1289.2 ml  Output 1300 ml  Net -10.8 ml   Physical Exam: Constitutional: well-appearing and sitting in bed comfortably eating a tomato sandwich, in no acute distress Pulmonary/Chest: normal work of breathing on room air Extremities: ampushield in place on left lower residual limb Neurological: alert and answering questions appropriately extremities  Pertinent Labs: BMP Latest Ref Rng & Units 06/23/2021 06/22/2021 06/21/2021  Glucose 70 - 99 mg/dL 08/21/2021) 130(Q) 657(Q)  BUN 8 - 23 mg/dL 469(G) 29(B) 28(U)  Creatinine 0.61 - 1.24 mg/dL 13(K) 4.40(N) 0.27(O)  Sodium 135 - 145 mmol/L 147(H) 143 144  Potassium 3.5 - 5.1 mmol/L 3.9 3.2(L) 3.5  Chloride 98 - 111 mmol/L 115(H) 115(H) 117(H)  CO2 22 - 32 mmol/L 24 22 20(L)  Calcium 8.9 - 10.3 mg/dL 5.36(U) 4.4(I) 8.3(L)   Protein/Creatinine Ratio, Urine 3.38  CBG (last 3)  Recent Labs    06/22/21 1631 06/22/21 2118 06/23/21 0630  GLUCAP 228* 229* 183*   Assessment/Plan:  Principal Problem:   Diabetic foot infection (HCC) Active Problems:    Proteinuria   Pressure injury of skin   Malnutrition of moderate degree   Gangrene (HCC)   Acute renal failure superimposed on stage 3a chronic kidney disease (HCC)   Atrial fibrillation (HCC)   Diabetes (HCC)  Patient Summary: Cameron Ruiz is a 87 year-old man with DM, CAD s/p CABG, HTN, HLD, and blindness who presented with painful wounds on the left foot and was admitted for management of a left diabetic foot wound with cellulitis/third digit osteomyelitis complicated by limb threatening ischemia secondary to severe PAD.  L diabetic foot infection with gangrene complicated by PVD, now POD5 L BKA Patient continues to deny pain or discomfort. PT/OT working with patient as he is awaiting SNF placement. Appreciate vascular surgery recommendations. -Continue pain management -Acetaminophen 650 mg q6h -oxycodone 5-10 mg q4h prn -Dressings/ampushield per surgical team -PT/OT -Will plant to discharge to SNF tomorrow -Vascular surgery arranging f/u in 4-5 weeks for staple removal   AKI Cr 1.45 improved from 1.58 yesterday after 1L bolus of IVF. Losartan remains held. Cr 1.08 on admission, so room for improvement remains. Potassium was low yesterday at 3.2 and improved after repletion with 40 mEq of Klor-con. Sodium of 147 today which puts him at a 2.3 L free water deficit. -Continue holding losartan 100 mg -Continue holding torsemide 10 mg -1/2 NS 100 mL/hr  for 12 hrs after bolus -Monitor BMP -Strict I/O  T2DM Blood sugar has been elevated to the 220s yesterday and 183 this morning. Post prandial measurements are elevated as well. -Continue SSI -Novolog 3 units TID if consumes at least 50% -Lantus 5 units   Moderate Malnutrition Patient with limited po intake over the past 9-10 days. Patient has been receiving daily Ensure but weight is down 6.2 kg since admission. Over the past day patient has made efforts to increase intake and has been reported to be eating more. CBG readings have  been elevated over the past 24 hrs which may reflect his incraased intake. Patient also has not had a bowel movement in 9 days despite bowel regimen. -continue Ensure supplement and assistance with meals -suppository or enema if no bowel movement in the next 1-2 days  HTN Blood pressures have been moderately elevated in the setting of holding losartan for AKI. -Start amlodipine 5 mg -Carvedilol 6.25 mg BID -Hydralazine 100 mg TID -Holding losartan 100 mg daily in the setting of AKI  Proteinuria Patient with frothy appearing urine and a UA significant for protein at 100. Urine protein/creatinine ratio elevated to 3.38. Likely a result of diabetic nephropathy with potential contribution from recent holding of losartan in the setting of his AKI. Patient would likely benefit from an outpatient workup. Serum albumin to investigate possible nephrotic syndrome would likely be unhelpful at this time due to difficulty discerning significance in the setting of low po intake.  Paroxysmal atrial fibrillation Telemetry showed a RBB but likely a chronic finding and baseline is unclear. Patient is asymptomatic. -Carvedilol 6.25 mg twice daily -Eliquis 2.5 mg twice daily   Pressure Injury Right Heel -Continue prevalon boot   CAD s/p CABG -Continue home aspirin, simvastatin   LOS: 11 days   Val Eagle, Medical Student 06/23/2021, 9:21 AM

## 2021-06-23 NOTE — TOC Progression Note (Addendum)
Transition of Care [facility]) - Progression Note    Patient Details  Name: Cameron Ruiz MRN: [id number] Date of Birth: August 19, 1931  [facility]) CM/SW Contact  Eduard Roux, [place] Phone Number: 06/23/2021, 2:00 PM  Clinical Narrative:     CSW informed SNF/[facility], anticipate d/c tomorrow.  CSW updated granddaughter,Melissa.  Antony Blackbird, MSW, LCSW Clinical Social Worker    Expected Discharge Plan: Skilled Nursing Facility Barriers to Discharge: Awaiting State Approval [facility]), Continued Medical Work up, SNF Pending bed offer  Expected Discharge Plan and Services Expected Discharge Plan: Skilled Nursing Facility In-house Referral: Clinical Social Work     Living arrangements for the past 2 months: Single Family Home                                       Social Determinants of Health (SDOH) Interventions    Readmission Risk Interventions No flowsheet data found.

## 2021-06-23 NOTE — Progress Notes (Signed)
Physical Therapy Treatment Patient Details Name: Cameron Ruiz MRN: [id number] DOB: December 12, 1930 Today's Date: 06/23/2021    History of Present Illness Cameron Ruiz is a 87 y.o. male admitted on 06/11/2021 with osteomyelitis of left foot wound. 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 PMH includes diabetes on insulin, cardiovascular disease s/p CABG, HTN, and HLD.    PT Comments    Pt received in supine, agreeable to therapy session and with good participation and tolerance for transfer training via slide board to/from wheelchair. Pt minA to Supervision for wheelchair mobility training in hallway ~18ft and quick to fatigue but participatory as able. Pt continues to benefit from PT services to progress toward functional mobility goals.    Follow Up Recommendations  SNF;Supervision/Assistance - 24 hour     Equipment Recommendations  Rolling walker with 5" wheels;3in1 (PT);Wheelchair (measurements PT);Wheelchair cushion (measurements PT);Other (comment) (drop arm BSC and wheelchair, elevating leg rests on WC, slide board)    Recommendations for Other Services       Precautions / Restrictions Precautions Precautions: Fall;Other (comment) Precaution Comments: poor vision, new L BKA Restrictions Weight Bearing Restrictions: Yes LLE Weight Bearing: Non weight bearing    Mobility  Bed Mobility Overal bed mobility: Needs Assistance Bed Mobility: Supine to Sit;Sit to Supine     Supine to sit: HOB elevated;Min assist Sit to supine: Mod assist   General bed mobility comments: Mod A to bring hips forward (able to assist LEs off of bed) and lift trunk. Good use of bed rails with cues. heavier assist to guide LEs and trunk back into bed.    Transfers Overall transfer level: Needs assistance Equipment used: Sliding board Transfers: Lateral/Scoot Transfers          Lateral/Scoot Transfers: Min assist;Mod assist;+2 safety/equipment;With slide board General transfer comment: +2 for safety, pt  needs +23modA/+2 minA for scooting and mod cues for technique       Corporate treasurer Wheelchair mobility: Yes Wheelchair propulsion: Both upper extremities Wheelchair parts: Needs assistance Distance: 100 Wheelchair Assistance Details (indicate cue type and reason): mod cues for technique and supervision to minA to propel chair, improvement as session progressed; slow pace     Balance Overall balance assessment: Needs assistance Sitting-balance support: No upper extremity supported;Feet unsupported Sitting balance-Leahy Scale: Fair Sitting balance - Comments: min guard to Supervision       Cognition Arousal/Alertness: Awake/alert Behavior During Therapy: Flat affect Overall Cognitive Status: Impaired/Different from baseline Area of Impairment: Following commands;Safety/judgement;Problem solving;Awareness          Following Commands: Follows one step commands with increased time;Follows multi-step commands with increased time Safety/Judgement: Decreased awareness of safety;Decreased awareness of deficits Awareness: Emergent Problem Solving: Slow processing;Decreased initiation;Difficulty sequencing;Requires verbal cues;Requires tactile cues General Comments: Pt pleasant and motivated to attempt all tasks, benefits from multimodal cues and breakdown of task steps. Pt able to follow directions consistently. Pt reports a family member passed away (sister in law?) today but still willing to participate.      Exercises Other Exercises Other Exercises: LLE AROM: quad sets x10 reps    General Comments General comments (skin integrity, edema, etc.): HR 57-61 bpm with exertion; RR 20's rpm, SpO2 100% on RA      Pertinent Vitals/Pain Pain Assessment: 0-10 Pain Score: 4  Pain Location: L BKA incision site Pain Descriptors / Indicators: Sore;Shooting Pain Intervention(s): Monitored during session;Repositioned     PT Goals (current goals can now be found  in the care plan section) Acute Rehab PT  Goals Patient Stated Goal: get stronger, go home PT Goal Formulation: With patient Time For Goal Achievement: 07/04/21 Potential to Achieve Goals: Fair Progress towards PT goals: Progressing toward goals    Frequency    Min 3X/week      PT Plan Current plan remains appropriate   AM-PAC PT "6 Clicks" Mobility   Outcome Measure  Help needed turning from your back to your side while in a flat bed without using bedrails?: A Little Help needed moving from lying on your back to sitting on the side of a flat bed without using bedrails?: A Lot Help needed moving to and from a bed to a chair (including a wheelchair)?: A Lot Help needed standing up from a chair using your arms (e.g., wheelchair or bedside chair)?: A Lot Help needed to walk in hospital room?: Total Help needed climbing 3-5 steps with a railing? : Total 6 Click Score: 11    End of Session   Activity Tolerance: Patient tolerated treatment well Patient left: in bed;with call bell/phone within reach;with bed alarm set;Other (comment) (prevalon boot to RLE) Nurse Communication: Mobility status;Need for lift equipment;Other (comment) (pt does well with Stedy or could try posterior scoot to [facility]) PT Visit Diagnosis: Unsteadiness on feet (R26.81);Muscle weakness (generalized) (M62.81);Difficulty in walking, not elsewhere classified (R26.2)     Time: 1601-0932 PT Time Calculation (min) (ACUTE ONLY): 42 min  Charges:  $Therapeutic Activity: 23-37 mins $Wheel Chair Management: 8-22 mins                     Lenoria Narine P., PTA Acute Rehabilitation Services Pager: [phone] Office: [phone]    Angus Palms 06/23/2021, 5:20 PM

## 2021-06-24 LAB — BASIC METABOLIC PANEL
Anion gap: 4 — ABNORMAL LOW (ref 5–15)
BUN: 34 mg/dL — ABNORMAL HIGH (ref 8–23)
CO2: 22 mmol/L (ref 22–32)
Calcium: 8.2 mg/dL — ABNORMAL LOW (ref 8.9–10.3)
Chloride: 119 mmol/L — ABNORMAL HIGH (ref 98–111)
Creatinine, Ser: 1.42 mg/dL — ABNORMAL HIGH (ref 0.61–1.24)
GFR, Estimated: 48 mL/min — ABNORMAL LOW (ref >60)
Glucose, Bld: 189 mg/dL — ABNORMAL HIGH (ref 70–99)
Potassium: 4.2 mmol/L (ref 3.5–5.1)
Sodium: 145 mmol/L (ref 135–145)

## 2021-06-24 LAB — GLUCOSE, CAPILLARY
Glucose-Capillary: 162 mg/dL — ABNORMAL HIGH (ref 70–99)
Glucose-Capillary: 159 mg/dL — ABNORMAL HIGH (ref 70–99)

## 2021-06-24 MED ORDER — ACETAMINOPHEN 325 MG PO TABS: 650.0000 mg | ORAL_TABLET | Freq: Four times a day (QID) | ORAL | 0 refills | Status: AC

## 2021-06-24 MED ORDER — OXYCODONE HCL 5 MG PO TABS: 5.0000 mg | ORAL_TABLET | ORAL | 0 refills | Status: DC | PRN

## 2021-06-24 MED ORDER — SENNOSIDES-DOCUSATE SODIUM 8.6-50 MG PO TABS: 1.0000 | ORAL_TABLET | Freq: Two times a day (BID) | ORAL | 0 refills | Status: AC

## 2021-06-24 MED ORDER — POLYETHYLENE GLYCOL 3350 17 G PO PACK: 17.0000 g | PACK | Freq: Every day | ORAL | 0 refills | Status: AC

## 2021-06-24 MED ORDER — OXYCODONE HCL 5 MG PO TABS: 5.0000 mg | ORAL_TABLET | ORAL | 0 refills | Status: AC | PRN

## 2021-06-24 MED ORDER — AMLODIPINE BESYLATE 5 MG PO TABS: 5.0000 mg | ORAL_TABLET | Freq: Every day | ORAL | 0 refills | Status: AC

## 2021-06-24 NOTE — Plan of Care (Signed)
DISCHARGE NOTE SNF Cameron Ruiz to be discharged Cameron Ruiz per MD order. Patient verbalized understanding.  Skin clean, dry and intact without evidence of skin break down, no evidence of skin tears noted. IV catheter discontinued intact. Site without signs and symptoms of complications. Dressing and pressure applied. Pt denies pain at the site currently. No complaints noted.  Patient free of lines, drains, and wounds.   Discharge packet assembled. An After Visit Summary (AVS) was printed and given to the EMS personnel. Patient escorted via stretcher and discharged to [name] via ambulance.   Arlice Colt, RN

## 2021-06-24 NOTE — Progress Notes (Signed)
Occupational Therapy Treatment Patient Details Name: Cameron Ruiz MRN: [id number] DOB: Dec 23, 1930 Today's Date: 06/24/2021    History of present illness Cameron Ruiz is a 87 y.o. male admitted on 06/11/2021 with osteomyelitis of left foot wound. 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 PMH includes diabetes on insulin, cardiovascular disease s/p CABG, HTN, and HLD.   OT comments  Session focused on progression of transfers with plans to translate to ADLs. Pt overall Mod A for sliding board transfer from bed to recliner, close to Min A. Pt demo good ability to push through R LE to assist, as well as UB strength. Pt able to demo chair push ups with plans to educate on theraband HEP in next session. Plan to also assess BSC transfers in next session to maximize LB ADL independence.    Follow Up Recommendations  SNF;Supervision/Assistance - 24 hour    Equipment Recommendations  3 in 1 bedside commode;Wheelchair (measurements OT);Wheelchair cushion (measurements OT)    Recommendations for Other Services      Precautions / Restrictions Precautions Precautions: Fall;Other (comment) Precaution Comments: poor vision, new L BKA Restrictions Weight Bearing Restrictions: Yes LLE Weight Bearing: Non weight bearing       Mobility Bed Mobility Overal bed mobility: Needs Assistance Bed Mobility: Supine to Sit     Supine to sit: HOB elevated;Min guard     General bed mobility comments: min guard to guide residual limb safely to EOB, use of bed rails    Transfers Overall transfer level: Needs assistance Equipment used: Sliding board Transfers: Lateral/Scoot Transfers Sit to Stand: Mod assist;From elevated surface        Lateral/Scoot Transfers: Mod assist General transfer comment: Mod A though close to Min A for sliding board transfer to recliner from slightly elevated bed. Pt able to push through R LE to assist well    Balance Overall balance assessment: Needs assistance Sitting-balance  support: No upper extremity supported;Feet unsupported Sitting balance-Leahy Scale: Fair                                     ADL either performed or assessed with clinical judgement   ADL Overall ADL's : Needs assistance/impaired Eating/Feeding: Set up;Sitting Eating/Feeding Details (indicate cue type and reason): to open containers, low vision Grooming: Set up;Sitting;Wash/dry hands;Wash/dry face Grooming Details (indicate cue type and reason): in recliner                               General ADL Comments: Session focused on progression of transfer abilities with plan to translate to ADL transfers in next session     Vision   Vision Assessment?: Vision impaired- to be further tested in functional context Additional Comments: low vision, some difficulty locating items on tray table   Perception     Praxis      Cognition Arousal/Alertness: Awake/alert Behavior During Therapy: Flat affect Overall Cognitive Status: Impaired/Different from baseline Area of Impairment: Following commands;Safety/judgement;Problem solving;Awareness                       Following Commands: Follows one step commands with increased time;Follows multi-step commands with increased time Safety/Judgement: Decreased awareness of safety;Decreased awareness of deficits Awareness: Emergent Problem Solving: Slow processing;Decreased initiation;Difficulty sequencing;Requires verbal cues;Requires tactile cues General Comments: Pt pleasant and motivated to attempt all tasks, benefits from multimodal cues and breakdown  of task steps. Pt able to follow directions consistently.        Exercises Exercises: Other exercises Other Exercises Other Exercises: chair push ups x 5   Shoulder Instructions       General Comments VSS on RA    Pertinent Vitals/ Pain       Pain Assessment: Faces Faces Pain Scale: Hurts a little bit Pain Location: L BKA incision site Pain  Descriptors / Indicators: Sore;Shooting Pain Intervention(s): Monitored during session;Repositioned  Home Living                                          Prior Functioning/Environment              Frequency  Min 2X/week        Progress Toward Goals  OT Goals(current goals can now be found in the care plan section)  Progress towards OT goals: Progressing toward goals  Acute Rehab OT Goals Patient Stated Goal: get stronger, go home OT Goal Formulation: With patient Time For Goal Achievement: 06/27/21 Potential to Achieve Goals: Fair ADL Goals Pt Will Perform Grooming: with modified independence;standing Pt Will Perform Upper Body Bathing: with modified independence;sitting Pt Will Perform Lower Body Bathing: with modified independence;sitting/lateral leans Pt Will Perform Upper Body Dressing: with modified independence;sitting Pt Will Perform Lower Body Dressing: with modified independence;sit to/from stand Pt Will Transfer to Toilet: with modified independence;ambulating;grab bars Pt Will Perform Toileting - Clothing Manipulation and hygiene: with modified independence;sitting/lateral leans  Plan Discharge plan needs to be updated;Frequency remains appropriate    Co-evaluation                 AM-PAC OT "6 Clicks" Daily Activity     Outcome Measure   Help from another person eating meals?: A Little Help from another person taking care of personal grooming?: A Little Help from another person toileting, which includes using toliet, bedpan, or urinal?: A Lot Help from another person bathing (including washing, rinsing, drying)?: A Little Help from another person to put on and taking off regular upper body clothing?: A Lot Help from another person to put on and taking off regular lower body clothing?: A Lot 6 Click Score: 15    End of Session Equipment Utilized During Treatment: Gait belt;Other (comment) (sliding board)  OT Visit Diagnosis:  Unsteadiness on feet (R26.81);Muscle weakness (generalized) (M62.81);Pain Pain - Right/Left: Left Pain - part of body: Leg   Activity Tolerance Patient tolerated treatment well   Patient Left in chair;with call bell/phone within reach;with chair alarm set   Nurse Communication Mobility status        Time: 4008-6761 OT Time Calculation (min): 27 min  Charges: OT General Charges $OT Visit: 1 Visit OT Treatments $Self Care/Home Management : 8-22 mins $Therapeutic Activity: 8-22 mins  Cameron Ruiz, Cameron Ruiz Acute Rehab Services Office: [phone]    Lorre Munroe 06/24/2021, 1:35 PM

## 2021-06-24 NOTE — Discharge Summary (Signed)
Name: Cameron Ruiz MRN: [id number] DOB: 10-26-1931 87 y.o. PCP: Glori Bickers, MD  Date of Admission: 06/11/2021  5:11 PM Date of Discharge: 06/24/2021 Attending Physician: Miguel Aschoff, MD  Discharge Diagnosis: 1. Left Diabetic Foot Infection Gangrene Left Third Digit Osteomyelitis Limb Threatening Ischemia 2/2 Severe PVD, now POD6 L BKA 2. AKI 3. Diabetes Mellitus Type II 4. HTN 5. Pressure Injury Right Heel 6. COVID-19 7. Paroxysmal Atrial Fibrillation 8. CAD s/p CABG 9. HLD Discharge Medications: Allergies as of 06/24/2021       Reactions   Carbocaine [mepivacaine]    Cogentin [benztropine]    Fluogen [influenza Virus Vaccine]    Flu & pneumonia vaccine   Keflex [cephalexin]    Kenalog [triamcinolone]    Levaquin [levofloxacin]         Medication List     STOP taking these medications    losartan 100 MG tablet Commonly known as: COZAAR       TAKE these medications    acetaminophen 325 MG tablet Commonly known as: TYLENOL Take 2 tablets (650 mg total) by mouth every 6 (six) hours.   amLODipine 5 MG tablet Commonly known as: NORVASC Take 1 tablet (5 mg total) by mouth daily. Start taking on: June 25, 2021   aspirin 81 MG chewable tablet Chew 81 mg by mouth once.   carvedilol 6.25 MG tablet Commonly known as: COREG Take 6.25 mg by mouth 2 (two) times daily.   doxepin 50 MG capsule Commonly known as: SINEQUAN Take 50 mg by mouth at bedtime.   Eliquis 2.5 MG Tabs tablet Generic drug: apixaban Take 2.5 mg by mouth 2 (two) times daily.   finasteride 5 MG tablet Commonly known as: PROSCAR Take 5 mg by mouth daily.   hydrALAZINE 100 MG tablet Commonly known as: APRESOLINE Take 100 mg by mouth 3 (three) times daily.   insulin aspart protamine- aspart (70-30) 100 UNIT/ML injection Commonly known as: NOVOLOG MIX 70/30 Inject 20-30 Units into the skin 2 (two) times daily at 8 am and 10 pm. 30 units am  20 units pm    ipratropium-albuterol 0.5-2.5 (3) MG/3ML Soln Commonly known as: DUONEB Inhale 3 mLs into the lungs every 6 (six) hours as needed (sob/wheezing).   nitroGLYCERIN 0.4 MG SL tablet Commonly known as: NITROSTAT Place 0.4 mg under the tongue every 5 (five) minutes as needed for chest pain.   omeprazole 20 MG capsule Commonly known as: PRILOSEC Take 20 mg by mouth daily.   oxyCODONE 5 MG immediate release tablet Commonly known as: Oxy IR/ROXICODONE Take 1-2 tablets (5-10 mg total) by mouth every 4 (four) hours as needed for severe pain.   polyethylene glycol 17 g packet Commonly known as: MIRALAX / GLYCOLAX Take 17 g by mouth daily. Start taking on: June 25, 2021   senna-docusate 8.6-50 MG tablet Commonly known as: Senokot-S Take 1 tablet by mouth 2 (two) times daily for 10 days.   simvastatin 20 MG tablet Commonly known as: ZOCOR Take 20 mg by mouth at bedtime.   Vitamin D (Ergocalciferol) 1.25 MG (50000 UNIT) Caps capsule Commonly known as: DRISDOL Take 50,000 Units by mouth every 14 (fourteen) days.               Discharge Care Instructions  (From admission, onward)           Start     Ordered   06/24/21 0000  Discharge wound care:       Comments: Change pressure ulcer  on buttocks twice daily. BKA incision site change dressing once daily and keep ampushield on left residual limb daily.   06/24/21 1329   06/24/21 0000  Discharge wound care:       Comments: Pressure wound on buttocks change dressing twice daily.  Left residual limb incision wound change dressing once a day.  And protect residual limb with ampushield.  Use Prevalon boot on right foot to decrease pressure on pressure wound.   06/24/21 1341            Disposition and follow-up:   Cameron Ruiz [facility] in Stable condition.  At the hospital follow up visit please address:  1. Left Diabetic Foot Infection Gangrene Left Third Digit  Osteomyelitis Limb Threatening Ischemia 2/2 Severe PVD, now POD6 L BKA-follow-up with incision site, ensure no new infection from incision site.  Follow-up with vascular surgery appointment in 4 weeks (see below appointment) 2. AKI- secondary to dehydration and losartan was held. Repeat BMP prior to restarting losartan. 3. HTN-hydralazine 100 mg 3 times daily, carvedilol 6.25 twice daily, amlodipine 5 mg daily.  Losartan held due to AKI.  Can restart losartan and discontinue amlodipine when creatinine is within normal limits. 4. Pressure Injury Right Heel-continue to use Prevalon boot to decrease pressure on the heel 5. COVID-19-quarantine for 10 days until negative test, assess daily for symptoms.  Currently asymptomatic 6. Paroxysmal Atrial Fibrillation-continue home medication Eliquis  2.  Labs / imaging needed at time of follow-up: BMP  3.  Pending labs/ test needing follow-up: None  Follow-up Appointments:  Follow-up Information     [facility] Follow up in 4 week(s).   Specialty: Vascular Surgery Why: Office will call you to arrange your appt (sent) Contact information: [address] [place] [phone]                Hospital Course by problem list: 1. Left Diabetic Foot Infection Gangrene Left Third Digit Osteomyelitis Limb Threatening Ischemia 2/2 Severe PVD, now POD6 L BKA Antibiotic treatment was started with a regimen of IV vancomycin, cefepime, and flagyl. Arteriogram performed on hospital day four revealed multiple occlusions in the left lower extremity and attempts to cross the lesions were unsuccessful. On hospital day seven, a left BKA was performed. Antibiotic treatment was completed 48 hours postoperatively, and patient was placed in an ampushield for left knee contracture. PT/OT recommended SNF placement for rehabilitation before returning home.  AKI Creatinine became elevated to 1.87 on hospital day  nine with a BUN/Cr ratio of >20. Home losartan and torsemide were held and patient was given IVF boluses on subsequent hospital days for suspected dehydration induced pre-renal acute kidney injury as patient had limited oral intake in the days following surgery. Cr steadily improved to 1.42 with the addition of fluids and holding of losartan.  Diabetes Mellitus Type II Patient was managed with sliding scale insulin throughout admission. On his last two hospital days, Novolog 3 units TID with consumption of at least 50% of meals and Lantus 5 units QHS were added.  HTN Home regimen was continued with hydralazine 100 mg TID, coreg 6.25 mg daily, and losartan 100 mg daily with temporary holding as above. Amlodipine 5 mg was added on last two hospital days as blood pressures became elevated in the setting of holding his losartan for the AKI.  Pressure Injury Right Heel Patient noted to have a boggy heel on physical exam, but skin integrity  remained intact. Prevalon boots were used to prevent further damage to the deep tissue.  COVID-19 Incidental finding upon anticipated discharge to SNF. Patient has been afebrile and asymptomatic.  Paroxysmal Atrial Fibrillation Monitoring was performed with telemetry and anticoagulation was administered with heparin gtt until two days after surgery when home Eliquis was resumed.  CAD s/p CABG Home regimen of ASA 81 mg and nitroglycerin 0.4 mg prn were continued.  HLD Home regiment of simvastatin 20 mg daily was continued.  Subjective: I seen and evaluated Mr. Raz at bedside.  He states that he was feeling sad, he just received the news that his wife's sister passed away.  However he is grateful for the care he received here and looks forward to making progress at SNF and being with his family.  Although he tested positive for COVID-19, he denies any headache, fever, chest pain, shortness of breath, myalgias, N/V, diarrhea or constipation.  He otherwise feels  well.  Discharge Exam:   BP (!) 143/93 (BP Location: Right Arm)   Pulse (!) 52   Temp 98.6 F (37 C) (Oral)   Resp 19   Ht 6\' 1"  (1.854 m) Comment: stated  Wt 93.4 kg   SpO2 93%   BMI 27.17 kg/m  Discharge exam: Physical Exam HENT:     Head: Normocephalic and atraumatic.  Cardiovascular:     Rate and Rhythm: Normal rate. Rhythm irregular.  Pulmonary:     Effort: Pulmonary effort is normal.     Breath sounds: Normal breath sounds.  Musculoskeletal:     Comments: Prevalon boot present on right foot     Left Lower Extremity: Left leg is amputated below knee.  Skin:    General: Skin is warm and dry.  Neurological:     General: No focal deficit present.     Mental Status: He is alert.  Psychiatric:        Mood and Affect: Mood normal.        Behavior: Behavior normal. Behavior is cooperative.     Pertinent Labs, Studies, and Procedures:  CBC Latest Ref Rng & Units 06/21/2021 06/20/2021 06/19/2021  WBC 4.0 - 10.5 K/uL 6.3 6.3 5.9  Hemoglobin 13.0 - 17.0 g/dL 08/19/2021) 5.4(Y) 10.1(L)  Hematocrit 39.0 - 52.0 % 28.7(L) 29.0(L) 30.7(L)  Platelets 150 - 400 K/uL 155 170 187   CMP Latest Ref Rng & Units 06/24/2021 06/23/2021 06/22/2021  Glucose 70 - 99 mg/dL 08/22/2021) 237(S) 283(T)  BUN 8 - 23 mg/dL 517(O) 16(W) 73(X)  Creatinine 0.61 - 1.24 mg/dL 10(G) 2.69(S) 8.54(O)  Sodium 135 - 145 mmol/L 145 147(H) 143  Potassium 3.5 - 5.1 mmol/L 4.2 3.9 3.2(L)  Chloride 98 - 111 mmol/L 119(H) 115(H) 115(H)  CO2 22 - 32 mmol/L 22 24 22   Calcium 8.9 - 10.3 mg/dL 8.2(L) 8.6(L) 8.2(L)  Total Protein 6.5 - 8.1 g/dL - - -  Total Bilirubin 0.3 - 1.2 mg/dL - - -  Alkaline Phos 38 - 126 U/L - - -  AST 15 - 41 U/L - - -  ALT 0 - 44 U/L - - -   CBC Latest Ref Rng & Units 06/21/2021 06/20/2021 06/19/2021  WBC 4.0 - 10.5 K/uL 6.3 6.3 5.9  Hemoglobin 13.0 - 17.0 g/dL 08/20/2021) 08/19/2021) 10.1(L)  Hematocrit 39.0 - 52.0 % 28.7(L) 29.0(L) 30.7(L)  Platelets 150 - 400 K/uL 155 170 187     Discharge  Instructions: Discharge Instructions     Call MD for:  difficulty breathing, headache or  visual disturbances   Complete by: As directed    Call MD for:  difficulty breathing, headache or visual disturbances   Complete by: As directed    Call MD for:  extreme fatigue   Complete by: As directed    Call MD for:  extreme fatigue   Complete by: As directed    Call MD for:  hives   Complete by: As directed    Call MD for:  hives   Complete by: As directed    Call MD for:  persistant dizziness or light-headedness   Complete by: As directed    Call MD for:  persistant dizziness or light-headedness   Complete by: As directed    Call MD for:  persistant nausea and vomiting   Complete by: As directed    Call MD for:  persistant nausea and vomiting   Complete by: As directed    Call MD for:  redness, tenderness, or signs of infection (pain, swelling, redness, odor or green/yellow discharge around incision site)   Complete by: As directed    Call MD for:  redness, tenderness, or signs of infection (pain, swelling, redness, odor or green/yellow discharge around incision site)   Complete by: As directed    Call MD for:  severe uncontrolled pain   Complete by: As directed    Call MD for:  severe uncontrolled pain   Complete by: As directed    Call MD for:  temperature >100.4   Complete by: As directed    Call MD for:  temperature >100.4   Complete by: As directed    Diet - low sodium heart healthy   Complete by: As directed    Diet - low sodium heart healthy   Complete by: As directed    Discharge instructions   Complete by: As directed    Amputation surgery of your left leg was successful. Unfortunately your lower left leg could not be saved due to severe life threatening infection. Going forward, physical therapy can assist you in getting a better handle on walking with assistance. They may introduce the use of prosthetics and other devices to get you mobile again.  Pain medicine will be  provided to you to relieve any pain you may have going through this process.  The nursing facility you are going to will also help change your wound dressings daily to make sure you don't get any more infections.   You had some injury to your kidney while in the hospital due to dehydration. You weren't drinking a lot of fluids and your were given fluids throuhgh your IV to help stay hydrated. So as a result of that, we stopped giving you your blood pressure medication, losartan because it could make your kidney damage worse. In place of that medication, I gave you a blood pressure medication called, amlodipine 5mg . You can take that once a day.  When you go see your primary care doctor, they can make any changes necessary.   Discharge wound care:   Complete by: As directed    Change pressure ulcer on buttocks twice daily. BKA incision site change dressing once daily and keep ampushield on left residual limb daily.   Discharge wound care:   Complete by: As directed    Pressure wound on buttocks change dressing twice daily.  Left residual limb incision wound change dressing once a day.  And protect residual limb with ampushield.  Use Prevalon boot on right foot to decrease pressure on pressure wound.  Increase activity slowly   Complete by: As directed    Increase activity slowly   Complete by: As directed        Signed: Dellis Filbert, MD 06/24/2021, 1:57 PM   Pager: [phone]

## 2021-06-24 NOTE — Progress Notes (Signed)
Physical Therapy Treatment Patient Details Name: Cameron Ruiz MRN: [id number] DOB: 06/04/1931 Today's Date: 06/24/2021    History of Present Illness Cameron Ruiz is a 87 y.o. male admitted on 06/11/2021 with osteomyelitis of left foot wound. 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 Pt tested +covid 8/10.PMH includes diabetes on insulin, cardiovascular disease s/p CABG, HTN, and HLD.    PT Comments    Pt fatigued and with LLE pain so limited standing today. Used Stedy for back to bed and readjusted limb protector.    Follow Up Recommendations  SNF;Supervision/Assistance - 24 hour     Equipment Recommendations  Rolling walker with 5" wheels;3in1 (PT);Wheelchair (measurements PT);Wheelchair cushion (measurements PT);Other (comment)    Recommendations for Other Services       Precautions / Restrictions Precautions Precautions: Fall;Other (comment) Precaution Comments: poor vision, new L BKA Restrictions Weight Bearing Restrictions: Yes LLE Weight Bearing: Non weight bearing    Mobility  Bed Mobility Overal bed mobility: Needs Assistance Bed Mobility: Sit to Supine     Supine to sit: HOB elevated;Min guard Sit to supine: Min guard   General bed mobility comments: Assist to monitor LLE    Transfers Overall transfer level: Needs assistance Equipment used: Ambulation equipment used Transfers: Lateral/Scoot Transfers Sit to Stand: Mod assist;+2 physical assistance        Lateral/Scoot Transfers: Mod assist General transfer comment: Assist to bring hips up and for balance. Pt unable to fully extend hips and knees  Ambulation/Gait                 Stairs             Wheelchair Mobility    Modified Rankin (Stroke Patients Only)       Balance Overall balance assessment: Needs assistance Sitting-balance support: No upper extremity supported;Feet unsupported Sitting balance-Leahy Scale: Fair     Standing balance support: During functional activity;Bilateral  upper extremity supported Standing balance-Leahy Scale: Poor Standing balance comment: Stedy and mod assist for static standing in flexed posture                            Cognition Arousal/Alertness: Awake/alert Behavior During Therapy: Flat affect Overall Cognitive Status: Impaired/Different from baseline Area of Impairment: Following commands;Safety/judgement;Problem solving;Awareness                       Following Commands: Follows one step commands with increased time;Follows multi-step commands with increased time Safety/Judgement: Decreased awareness of safety;Decreased awareness of deficits Awareness: Emergent Problem Solving: Slow processing;Decreased initiation;Difficulty sequencing;Requires verbal cues;Requires tactile cues General Comments: Pt pleasant and motivated to attempt all tasks, benefits from multimodal cues and breakdown of task steps. Pt able to follow directions consistently.      Exercises Other Exercises Other Exercises: chair push ups x 5    General Comments General comments (skin integrity, edema, etc.): VSS on RA      Pertinent Vitals/Pain Pain Assessment: Faces Faces Pain Scale: Hurts little more Pain Location: LLE Pain Descriptors / Indicators: Sore;Shooting Pain Intervention(s): Monitored during session;Repositioned    Home Living                      Prior Function            PT Goals (current goals can now be found in the care plan section) Acute Rehab PT Goals Patient Stated Goal: get stronger, go home Progress towards PT goals: Not  progressing toward goals - comment    Frequency    Min 2X/week      PT Plan Current plan remains appropriate;Frequency needs to be updated    Co-evaluation              AM-PAC PT "6 Clicks" Mobility   Outcome Measure  Help needed turning from your back to your side while in a flat bed without using bedrails?: A Little Help needed moving from lying on your  back to sitting on the side of a flat bed without using bedrails?: A Lot Help needed moving to and from a bed to a chair (including a wheelchair)?: Total Help needed standing up from a chair using your arms (e.g., wheelchair or bedside chair)?: Total Help needed to walk in hospital room?: Total Help needed climbing 3-5 steps with a railing? : Total 6 Click Score: 9    End of Session   Activity Tolerance: Patient limited by fatigue Patient left: in bed;with call bell/phone within reach;with bed alarm set Nurse Communication: Mobility status PT Visit Diagnosis: Unsteadiness on feet (R26.81);Muscle weakness (generalized) (M62.81);Difficulty in walking, not elsewhere classified (R26.2)     Time: 1348-1410 PT Time Calculation (min) (ACUTE ONLY): 22 min  Charges:  $Therapeutic Activity: 8-22 mins                     [facility] PT Acute Rehabilitation Services Pager [phone] Office [phone]    Angelina Ok [facility] 06/24/2021, 2:56 PM

## 2021-06-24 NOTE — Plan of Care (Signed)

## 2021-06-24 NOTE — TOC Transition Note (Signed)
Transition of Care [facility]) - CM/SW Discharge Note   Patient Details  Name: Cameron Ruiz MRN: [id number] Date of Birth: 1931/03/12  [facility] of [facility] CM/SW Contact:  Eduard Roux, LCSW Phone Number: 06/24/2021, 1:26 PM   Clinical Narrative:     Patient will Discharge to: Cameron Ruiz  Discharge Date: 06/24/2021 Family Notified: Granddaughter, Cameron Ruiz Transport By:   Sharin Mons  Per MD patient is ready for discharge. RN, patient, and facility notified of discharge. Discharge Summary sent to facility. RN given number for report [phone], ext [phone] Room 57. Ambulance transport requested for patient.   Clinical Social Worker signing off.  Antony Blackbird, MSW, LCSW Clinical Social Worker   Final next level of care: Skilled Nursing Facility Barriers to Discharge: Barriers Resolved   Patient Goals and CMS Choice        Discharge Placement              Patient chooses bed at: [facility] Patient to be transferred to facility by: [facility] Name of family member notified: granddaugter,Cameron Ruiz Patient and family notified of of transfer: 06/24/21  Discharge Plan and Services In-house Referral: Clinical Social Work                                   Social Determinants of Health (SDOH) Interventions     Readmission Risk Interventions No flowsheet data found.

## 2021-07-14 ENCOUNTER — Telehealth: Payer: Self-pay

## 2021-07-14 NOTE — Telephone Encounter (Signed)
Tasha from hospice calls today to ask about patient's BKA wound from 06/18/2021. Says it looks good, no drainage, redness or swelling. Patient is apparently very wiggly and cannot keep a bandage on. Advised they could keep open to air if necessary - just keep clean and dry. Instructed to call if any s/s of infection develop. Patient is still planning to make follow up appoint on 9/8 - but being a hospice patient - I advised RN they could remove staples if patient was unable to make appt. Verbalized understanding.

## 2021-08-14 DEATH — deceased

## 2022-08-29 IMAGING — MR MR FOOT*L* W/O CM
4 of 6 series · 19 of 40 positions shown · non-contrast
Comparison: Plain films left foot 06/11/2021

CLINICAL DATA: Diabetic patient with dry gangrene of the great toe
of the left foot. Left foot redness, pain and swelling.

EXAM:
MRI OF THE LEFT FOOT WITHOUT CONTRAST
TECHNIQUE: Multiplanar, multisequence MR imaging of the left foot was
performed. No intravenous contrast was administered.

[Series 6: T1 · oblique · 3.0mm · 0.27mm/px · 5 of 47 slices shown (1 of 2)]
[im 1/47]
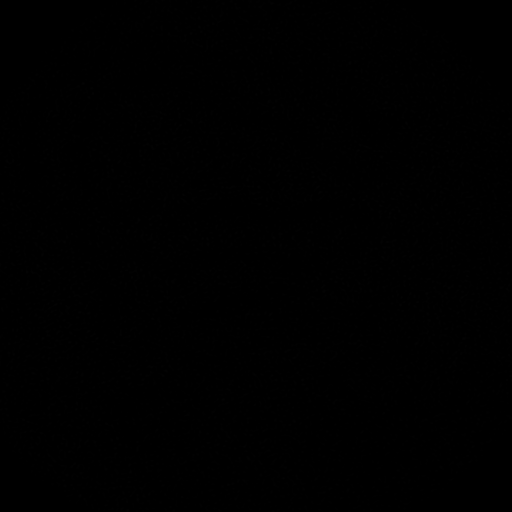
[im 6/47]
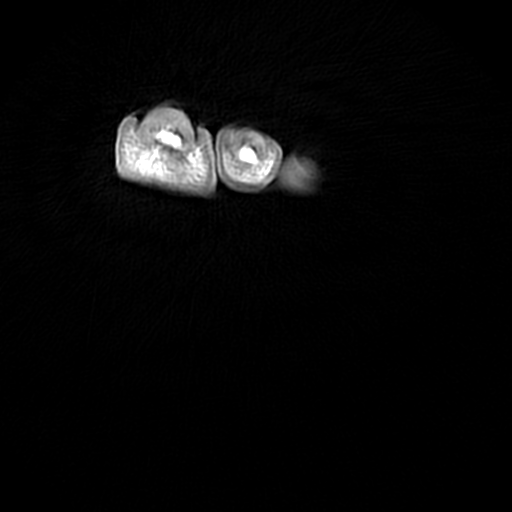
[im 12/47]
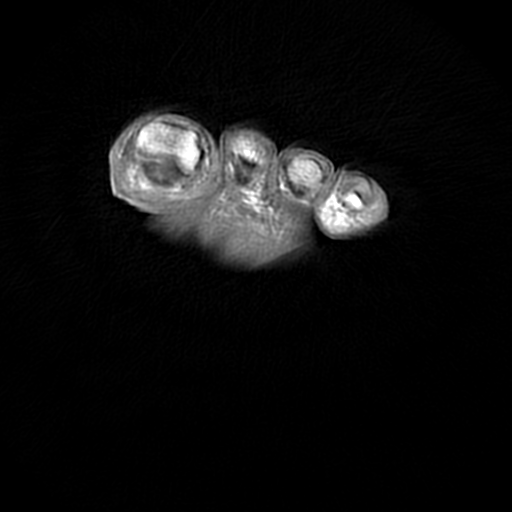
[im 24/47]
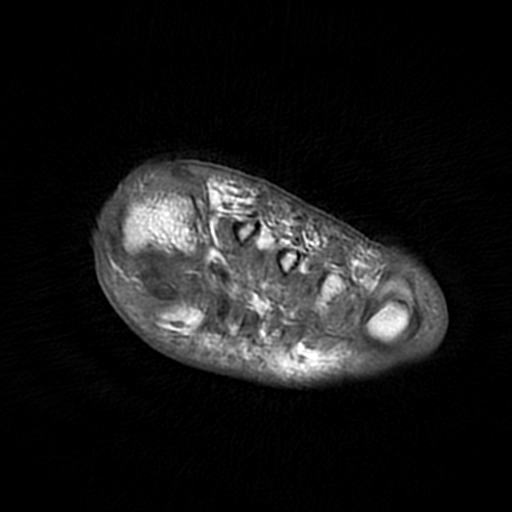
[im 41/47]
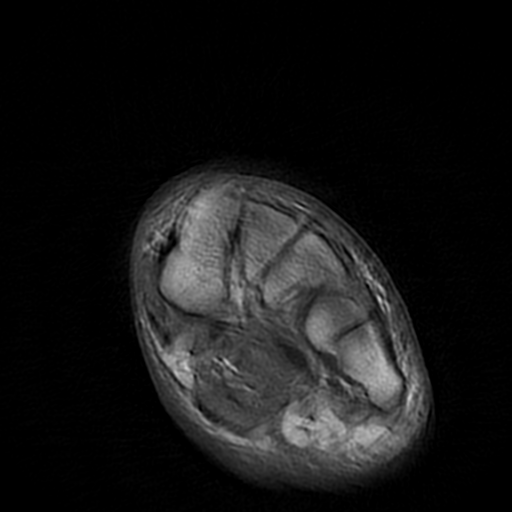

[Series 7: T1 fat-sat · oblique · non-contrast · 3.0mm · 0.27mm/px · 3 of 47 slices shown]
[im 7/47]
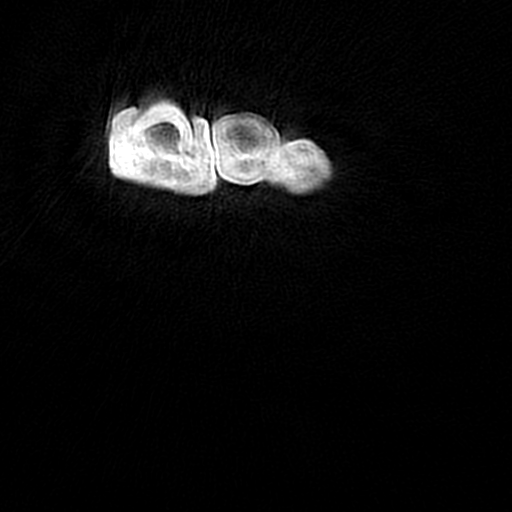
[im 27/47]
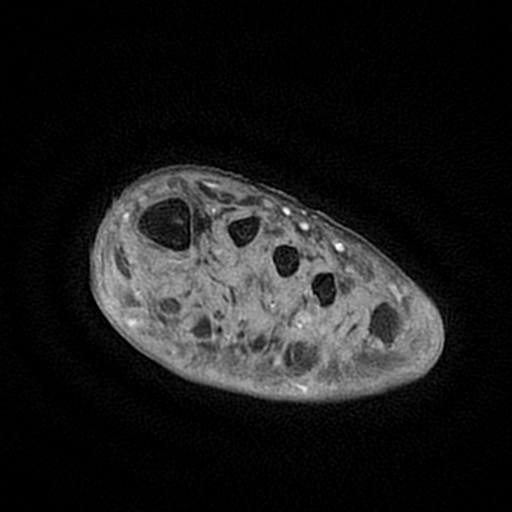
[im 40/47]
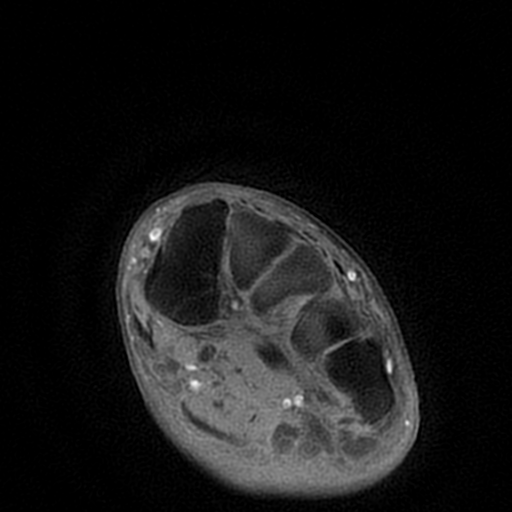

[Series 8: T2 fat-sat · oblique · 3.0mm · 0.27mm/px · 8 of 47 slices shown]
[im 1/47]
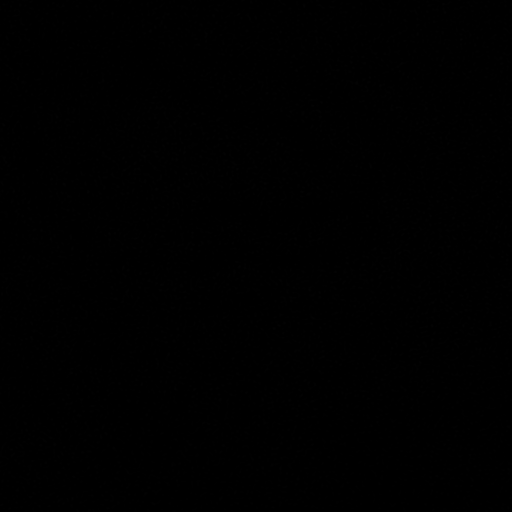
[im 7/47]
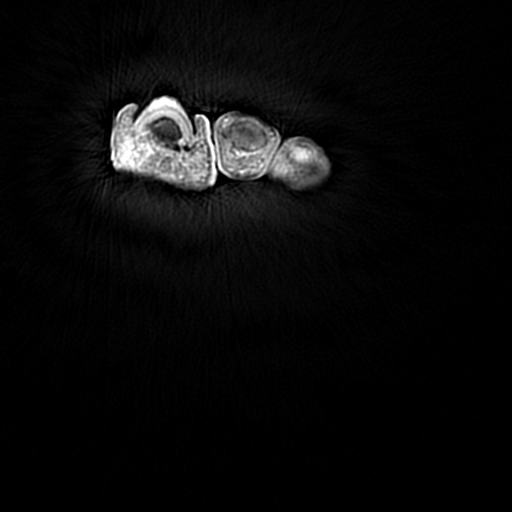
[im 14/47]
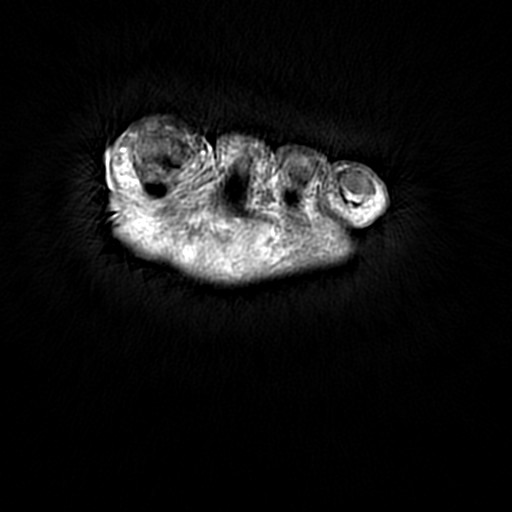
[im 20/47]
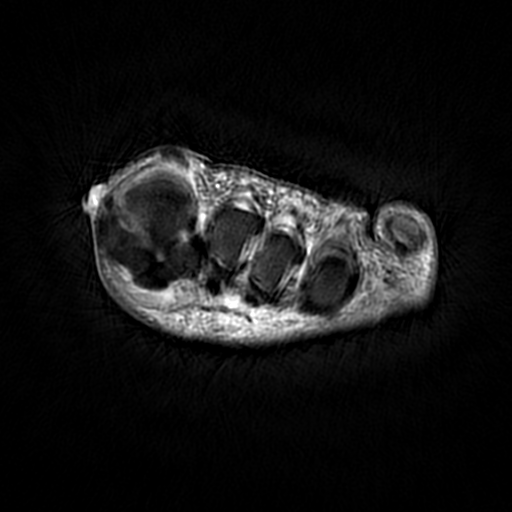
[im 27/47]
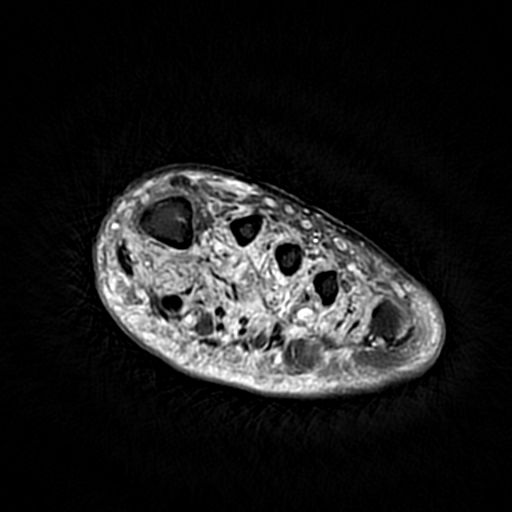
[im 33/47]
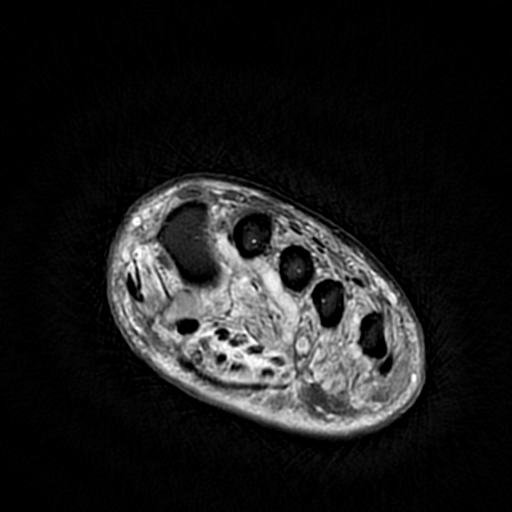
[im 40/47]
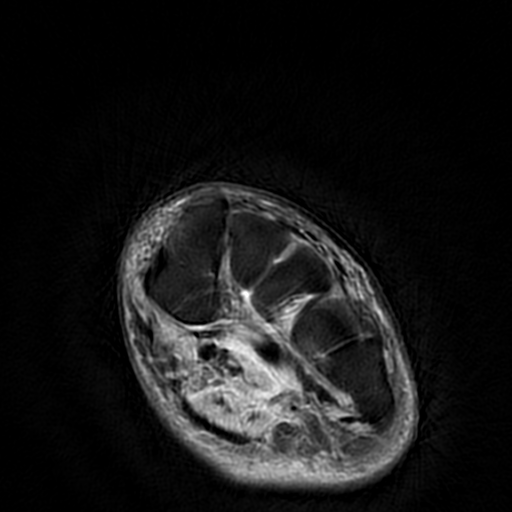
[im 47/47]
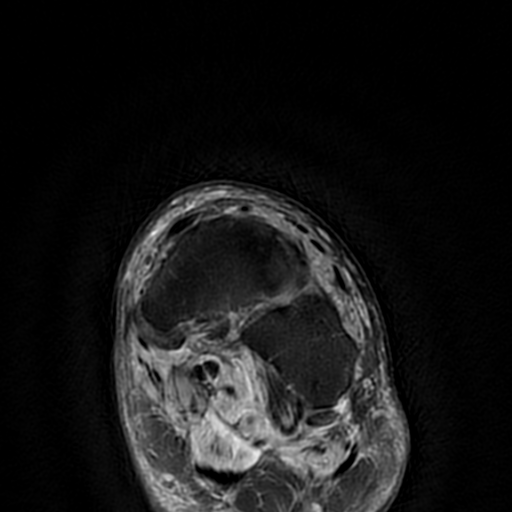

[Series 13: T1 · oblique · 3.0mm · 0.31mm/px · 3 of 31 slices shown (2 of 2)]
[im 1/31]
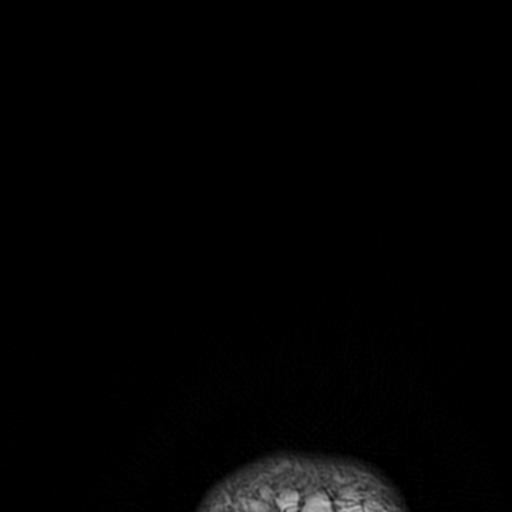
[im 16/31]
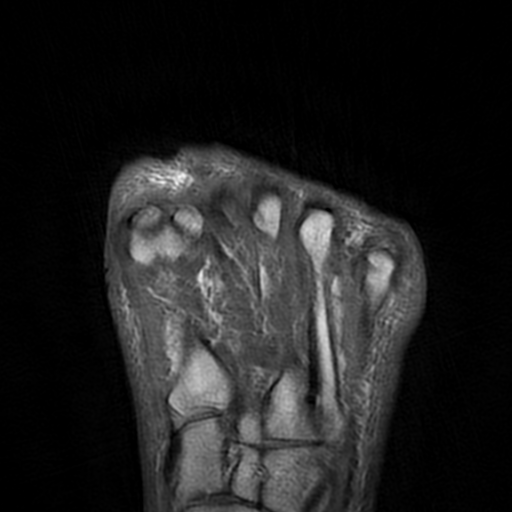
[im 31/31]
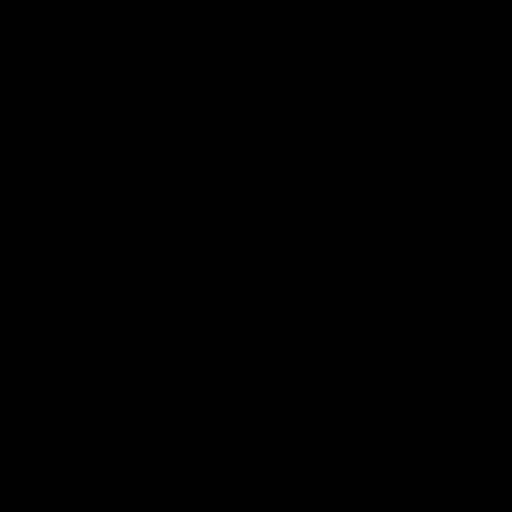

[19 of 40 positions shown; findings below may reference images not displayed]

FINDINGS: Bones/Joint/Cartilage

Patient motion degrades the study. Increased T2 signal is seen
throughout the distal phalanx of the third toe with corresponding
moderately decreased signal on T1 weighted imaging. Marrow signal is
otherwise normal. Specifically, marrow signal throughout the great
toe is normal. No fracture or stress change. No joint effusion.

Ligaments

Intact.

Muscles and Tendons

No intramuscular fluid collection is seen. Intermediate increased T2
signal in all intrinsic musculature of the foot is likely due to
diabetic myopathy.

Soft tissues

There appears to be a skin wound adjacent to the first MTP joint. No
abscess is identified. Mild subcutaneous edema is present about the
dorsum of the foot.
IMPRESSION: Marrow edema throughout the distal phalanx of the third toe is
compatible with osteomyelitis. No other evidence of osteomyelitis is
seen. Negative for abscess or septic joint.
